# Patient Record
Sex: Male | Born: 1985
Health system: Southern US, Community
[De-identification: ages and names within clinical notes are randomized; demographics above are authoritative.]

## PROBLEM LIST (undated history)

## (undated) ENCOUNTER — Emergency Department (HOSPITAL_COMMUNITY): Admission: EM | Payer: 59 | Source: Home / Self Care

## (undated) DIAGNOSIS — F319 Bipolar disorder, unspecified: Secondary | ICD-10-CM

## (undated) DIAGNOSIS — F209 Schizophrenia, unspecified: Secondary | ICD-10-CM

## (undated) HISTORY — PX: APPENDECTOMY: SHX54

---

## 2014-12-07 ENCOUNTER — Emergency Department (HOSPITAL_COMMUNITY)
Admission: EM | Admit: 2014-12-07 | Discharge: 2014-12-08 | Disposition: A | Discharge status: Other Acute Inpatient Hospital | Payer: BLUE CROSS/BLUE SHIELD | Attending: Emergency Medicine | Admitting: Emergency Medicine

## 2014-12-07 ENCOUNTER — Encounter (HOSPITAL_COMMUNITY): Payer: Self-pay | Admitting: *Deleted

## 2014-12-07 DIAGNOSIS — F1210 Cannabis abuse, uncomplicated: Secondary | ICD-10-CM | POA: Insufficient documentation

## 2014-12-07 DIAGNOSIS — T450X2A Poisoning by antiallergic and antiemetic drugs, intentional self-harm, initial encounter: Secondary | ICD-10-CM | POA: Insufficient documentation

## 2014-12-07 DIAGNOSIS — Y929 Unspecified place or not applicable: Secondary | ICD-10-CM | POA: Insufficient documentation

## 2014-12-07 DIAGNOSIS — Y998 Other external cause status: Secondary | ICD-10-CM | POA: Insufficient documentation

## 2014-12-07 DIAGNOSIS — F329 Major depressive disorder, single episode, unspecified: Secondary | ICD-10-CM | POA: Insufficient documentation

## 2014-12-07 DIAGNOSIS — T50902A Poisoning by unspecified drugs, medicaments and biological substances, intentional self-harm, initial encounter: Secondary | ICD-10-CM

## 2014-12-07 DIAGNOSIS — Z720 Tobacco use: Secondary | ICD-10-CM | POA: Insufficient documentation

## 2014-12-07 DIAGNOSIS — Y939 Activity, unspecified: Secondary | ICD-10-CM | POA: Insufficient documentation

## 2014-12-07 DIAGNOSIS — F1220 Cannabis dependence, uncomplicated: Secondary | ICD-10-CM | POA: Diagnosis present

## 2014-12-07 DIAGNOSIS — F3163 Bipolar disorder, current episode mixed, severe, without psychotic features: Secondary | ICD-10-CM | POA: Diagnosis present

## 2014-12-07 DIAGNOSIS — T470X2A Poisoning by histamine H2-receptor blockers, intentional self-harm, initial encounter: Secondary | ICD-10-CM | POA: Insufficient documentation

## 2014-12-07 DIAGNOSIS — R45851 Suicidal ideations: Secondary | ICD-10-CM

## 2014-12-07 DIAGNOSIS — T391X2A Poisoning by 4-Aminophenol derivatives, intentional self-harm, initial encounter: Secondary | ICD-10-CM | POA: Insufficient documentation

## 2014-12-07 DIAGNOSIS — X58XXXA Exposure to other specified factors, initial encounter: Secondary | ICD-10-CM | POA: Insufficient documentation

## 2014-12-07 LAB — COMPREHENSIVE METABOLIC PANEL
ALT: 18 U/L (ref 17–63)
AST: 27 U/L (ref 15–41)
Albumin: 4.4 g/dL (ref 3.5–5.0)
Alkaline Phosphatase: 69 U/L (ref 38–126)
Anion gap: 9 (ref 5–15)
BILIRUBIN TOTAL: 1.4 mg/dL — AB (ref 0.3–1.2)
BUN: 13 mg/dL (ref 6–20)
CHLORIDE: 106 mmol/L (ref 101–111)
CO2: 25 mmol/L (ref 22–32)
Calcium: 9.4 mg/dL (ref 8.9–10.3)
Creatinine, Ser: 1.02 mg/dL (ref 0.61–1.24)
GFR calc Af Amer: >60 mL/min (ref >60)
GFR calc non Af Amer: >60 mL/min (ref >60)
Glucose, Bld: 85 mg/dL (ref 65–99)
POTASSIUM: 3.8 mmol/L (ref 3.5–5.1)
Sodium: 140 mmol/L (ref 135–145)
Total Protein: 7.6 g/dL (ref 6.5–8.1)

## 2014-12-07 LAB — URINALYSIS, ROUTINE W REFLEX MICROSCOPIC
Bilirubin Urine: NEGATIVE
Glucose, UA: NEGATIVE mg/dL
Hgb urine dipstick: NEGATIVE
Ketones, ur: NEGATIVE mg/dL
Leukocytes, UA: NEGATIVE
Nitrite: NEGATIVE
Protein, ur: NEGATIVE mg/dL
Specific Gravity, Urine: 1.011 (ref 1.005–1.030)
Urobilinogen, UA: 1 mg/dL (ref 0.0–1.0)
pH: 6.5 (ref 5.0–8.0)

## 2014-12-07 LAB — RAPID URINE DRUG SCREEN, HOSP PERFORMED
Amphetamines: NOT DETECTED
Barbiturates: NOT DETECTED
Benzodiazepines: NOT DETECTED
Cocaine: NOT DETECTED
Opiates: NOT DETECTED
TETRAHYDROCANNABINOL: POSITIVE — AB

## 2014-12-07 LAB — CBC WITH DIFFERENTIAL/PLATELET
BASOS PCT: 2 % — AB (ref 0–1)
Basophils Absolute: 0.1 10*3/uL (ref 0.0–0.1)
EOS PCT: 3 % (ref 0–5)
Eosinophils Absolute: 0.2 10*3/uL (ref 0.0–0.7)
HEMATOCRIT: 42.7 % (ref 39.0–52.0)
Hemoglobin: 13.9 g/dL (ref 13.0–17.0)
LYMPHS ABS: 1.9 10*3/uL (ref 0.7–4.0)
Lymphocytes Relative: 27 % (ref 12–46)
MCH: 27.2 pg (ref 26.0–34.0)
MCHC: 32.6 g/dL (ref 30.0–36.0)
MCV: 83.6 fL (ref 78.0–100.0)
Monocytes Absolute: 0.6 10*3/uL (ref 0.1–1.0)
Monocytes Relative: 8 % (ref 3–12)
Neutro Abs: 4.3 10*3/uL (ref 1.7–7.7)
Neutrophils Relative %: 60 % (ref 43–77)
Platelets: 186 10*3/uL (ref 150–400)
RBC: 5.11 MIL/uL (ref 4.22–5.81)
RDW: 14.2 % (ref 11.5–15.5)
WBC: 7.1 10*3/uL (ref 4.0–10.5)

## 2014-12-07 LAB — ACETAMINOPHEN LEVEL
ACETAMINOPHEN (TYLENOL), SERUM: 10 ug/mL (ref 10–30)
Acetaminophen (Tylenol), Serum: 26 ug/mL (ref 10–30)

## 2014-12-07 LAB — SALICYLATE LEVEL: Salicylate Lvl: <4 mg/dL (ref 2.8–30.0)

## 2014-12-07 LAB — ETHANOL: Alcohol, Ethyl (B): <5 mg/dL (ref <5)

## 2014-12-07 MED ORDER — NICOTINE 21 MG/24HR TD PT24: 21.0000 mg | MEDICATED_PATCH | Freq: Once | TRANSDERMAL | Status: DC

## 2014-12-07 MED ORDER — LORAZEPAM 1 MG PO TABS
1.0000 mg | ORAL_TABLET | Freq: Once | ORAL | Status: AC
  Administered 2014-12-07: 1 mg via ORAL
  Filled 2014-12-07: qty 1

## 2014-12-07 MED ORDER — SODIUM CHLORIDE 0.9 % IV BOLUS (SEPSIS)
1000.0000 mL | INTRAVENOUS | Status: AC
  Administered 2014-12-07: 1000 mL via INTRAVENOUS

## 2014-12-07 MED ORDER — CHARCOAL ACTIVATED PO LIQD
50.0000 g | Freq: Once | ORAL | Status: DC
  Filled 2014-12-07: qty 240

## 2014-12-07 NOTE — ED Notes (Signed)
Pt. Noted sleeping in room. No complaints or concerns voiced. No distress or abnormal behavior noted. Will continue to monitor with security cameras. Q 15 minute rounds continue. 

## 2014-12-07 NOTE — ED Notes (Signed)
Dr Aline Brochure aware that patient refused to drink charcoal.  Information acknowledged

## 2014-12-07 NOTE — ED Notes (Signed)
Bed: RESB Expected date: 12/07/14 Expected time: 12:22 PM Means of arrival: Ambulance Comments: SI attempt/ multiple drugs

## 2014-12-07 NOTE — BH Assessment (Signed)
Spoke with Alyse Low at [facility] who requested a copy of pt's UDS. Informed Alyse Low that a copy will be faxed over once it is resulted. Spoke with nurse who reported that they are still waiting for pt to provide urine. TTS will fax over results once it is resulted.

## 2014-12-07 NOTE — ED Provider Notes (Signed)
CSN: [id number]     Arrival date & time 12/07/14  1234 History   First MD Initiated Contact with Patient 12/07/14 1240     Chief Complaint  Patient presents with  . Suicidal     (Consider location/radiation/quality/duration/timing/severity/associated sxs/prior Treatment) Patient is a 29 y.o. male presenting with mental health disorder. The history is provided by the patient.  Mental Health Problem Presenting symptoms: suicidal thoughts and suicide attempt   Degree of incapacity (severity):  Moderate Onset quality:  Gradual Timing:  Constant Progression:  Worsening Chronicity:  Recurrent Context: stressful life event   Treatment compliance:  Some of the time Relieved by:  Nothing Worsened by:  Nothing tried Ineffective treatments:  None tried Associated symptoms: no abdominal pain, no chest pain and no headaches     History reviewed. No pertinent past medical history. History reviewed. No pertinent past surgical history. History reviewed. No pertinent family history. History  Substance Use Topics  . Smoking status: Current Every Day Smoker    Types: Cigarettes  . Smokeless tobacco: Not on file  . Alcohol Use: No     Comment: beer every once in a while - "yesterday had a boatload of vodka"    Review of Systems  Constitutional: Negative for fever.  HENT: Negative for drooling and rhinorrhea.   Eyes: Negative for pain.  Respiratory: Negative for cough and shortness of breath.   Cardiovascular: Negative for chest pain and leg swelling.  Gastrointestinal: Negative for nausea, vomiting, abdominal pain and diarrhea.  Genitourinary: Negative for dysuria and hematuria.  Musculoskeletal: Negative for gait problem and neck pain.  Skin: Negative for color change.  Neurological: Negative for numbness and headaches.  Hematological: Negative for adenopathy.  Psychiatric/Behavioral: Positive for suicidal ideas.  All other systems reviewed and are negative.     Allergies   Review of patient's allergies indicates no known allergies.  Home Medications   Prior to Admission medications   Not on File   BP 120/71 mmHg  Pulse 74  Temp(Src) 98.3 F (36.8 C) (Oral)  Resp 20  SpO2 100% Physical Exam  Constitutional: He is oriented to person, place, and time. He appears well-developed and well-nourished.  HENT:  Head: Normocephalic and atraumatic.  Right Ear: External ear normal.  Left Ear: External ear normal.  Nose: Nose normal.  Mouth/Throat: Oropharynx is clear and moist. No oropharyngeal exudate.  Eyes: Conjunctivae and EOM are normal. Pupils are equal, round, and reactive to light.  Neck: Normal range of motion. Neck supple.  Cardiovascular: Normal rate, regular rhythm, normal heart sounds and intact distal pulses.  Exam reveals no gallop and no friction rub.   No murmur heard. Pulmonary/Chest: Effort normal and breath sounds normal. No respiratory distress. He has no wheezes.  Abdominal: Soft. Bowel sounds are normal. He exhibits no distension. There is no tenderness. There is no rebound and no guarding.  Musculoskeletal: Normal range of motion. He exhibits no edema or tenderness.  Neurological: He is alert and oriented to person, place, and time.  Skin: Skin is warm and dry.  Psychiatric: He exhibits a depressed mood. He expresses suicidal ideation.  Nursing note and vitals reviewed.   ED Course  Procedures (including critical care time) Labs Review Labs Reviewed  CBC WITH DIFFERENTIAL/PLATELET - Abnormal; Notable for the following:    Basophils Relative 2 (*)    All other components within normal limits  COMPREHENSIVE METABOLIC PANEL - Abnormal; Notable for the following:    Total Bilirubin 1.4 (*)  All other components within normal limits  URINE RAPID DRUG SCREEN (HOSP PERFORMED) - Abnormal; Notable for the following:    Tetrahydrocannabinol POSITIVE (*)    All other components within normal limits  ACETAMINOPHEN LEVEL  SALICYLATE  LEVEL  ETHANOL  URINALYSIS, ROUTINE W REFLEX MICROSCOPIC  ACETAMINOPHEN LEVEL    Imaging Review No results found.   EKG Interpretation   Date/Time:  Sunday Dec 07 2014 12:37:22 EDT Ventricular Rate:  74 PR Interval:  130 QRS Duration: 85 QT Interval:  360 QTC Calculation: 399 R Axis:   87 Text Interpretation:  Sinus rhythm ST elev, probable normal early repol  pattern No prior for comparision Confirmed by DOCHERTY  MD, MEGAN [phone])  on 12/07/2014 12:40:21 PM      MDM   Final diagnoses:  Overdose, intentional self-harm, initial encounter    12:51 PM 29 y.o. male who presents after an overdose which occurred around noon today. He states that he took approximately 5000 mg of acetaminophen, and full of ibuprofen, 15 Zantac, 20 Claritin, smoke marijuana, snorted 3 puffs of hairspray. He states he did this over a several minute time period. He states that yesterday he was drinking alcohol. He is alert and oriented on exam. He is actively suicidal. We'll get screening labs and imaging including four-hour Tylenol level. Nurse to contact the poison center. Will give activated charcoal. He describes life stressors as the cause of his suicidal tendencies.  Pt continues to appear well. Dr. Ayesha Rumpf to f/u on 4hr tylenol level. Pt will otherwise be medically cleared. He was IVC'd pta.   Pamella Pert, MD 12/08/14 1754

## 2014-12-07 NOTE — ED Notes (Signed)
Pt sitting quietly in bed, tearful.  Pt ate approx 80% of supper,  Pt declined nic. Patch or ativan at this time
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
## 2014-12-07 NOTE — BH Assessment (Signed)
Consulted with Waylan Boga, DNP who states that patient meets inpatient criteria at this time.

## 2014-12-07 NOTE — ED Notes (Signed)
Attempted to call report.  Nurse unavailable.  She is to return call

## 2014-12-07 NOTE — ED Notes (Signed)
Report received from Janie Rambo RN. Pt. Sleeping, respirations regular and unlabored. Will continue to monitor for safety via security cameras and Q 15 minute checks. 

## 2014-12-07 NOTE — ED Notes (Signed)
Pt took over 5000 mg acetomenaphe; handful of ibuprofen, 15 pills of zantac, 20 pills of claritinn, smoked marijuana, snorted 3 puffs of hairspray. Hx of attempting to harm self  Hx: manic bipolar depressive, chronic bronchitis, anxiety Noncompliant with medications A&O Cooperative

## 2014-12-07 NOTE — ED Provider Notes (Signed)
Patient visit shared with Dr. Aline Brochure. Patient here involuntarily following suicide attempt by overdose. Repeat acetaminophen level is less than 10, no indication for NAC treatment. Patient has been medically cleared for psychiatric evaluation.  Quintella Reichert, MD 12/07/14 2352

## 2014-12-08 ENCOUNTER — Inpatient Hospital Stay (HOSPITAL_COMMUNITY)
Admission: AD | Admit: 2014-12-08 | Discharge: 2014-12-11 | DRG: 885 | Disposition: A | Discharge status: Home / Self Care | Payer: BLUE CROSS/BLUE SHIELD | Source: Intra-hospital | Attending: Psychiatry | Admitting: Psychiatry

## 2014-12-08 ENCOUNTER — Encounter (HOSPITAL_COMMUNITY): Payer: Self-pay | Admitting: *Deleted

## 2014-12-08 DIAGNOSIS — T50902A Poisoning by unspecified drugs, medicaments and biological substances, intentional self-harm, initial encounter: Secondary | ICD-10-CM | POA: Diagnosis not present

## 2014-12-08 DIAGNOSIS — T391X2A Poisoning by 4-Aminophenol derivatives, intentional self-harm, initial encounter: Secondary | ICD-10-CM

## 2014-12-08 DIAGNOSIS — F3160 Bipolar disorder, current episode mixed, unspecified: Principal | ICD-10-CM | POA: Diagnosis present

## 2014-12-08 DIAGNOSIS — F1220 Cannabis dependence, uncomplicated: Secondary | ICD-10-CM | POA: Insufficient documentation

## 2014-12-08 DIAGNOSIS — R45851 Suicidal ideations: Secondary | ICD-10-CM

## 2014-12-08 DIAGNOSIS — F17210 Nicotine dependence, cigarettes, uncomplicated: Secondary | ICD-10-CM | POA: Diagnosis present

## 2014-12-08 DIAGNOSIS — T1491 Suicide attempt: Secondary | ICD-10-CM | POA: Diagnosis not present

## 2014-12-08 DIAGNOSIS — F3163 Bipolar disorder, current episode mixed, severe, without psychotic features: Secondary | ICD-10-CM | POA: Diagnosis present

## 2014-12-08 DIAGNOSIS — T39312A Poisoning by propionic acid derivatives, intentional self-harm, initial encounter: Secondary | ICD-10-CM

## 2014-12-08 DIAGNOSIS — F1290 Cannabis use, unspecified, uncomplicated: Secondary | ICD-10-CM | POA: Diagnosis present

## 2014-12-08 DIAGNOSIS — F332 Major depressive disorder, recurrent severe without psychotic features: Secondary | ICD-10-CM | POA: Diagnosis present

## 2014-12-08 DIAGNOSIS — Z9114 Patient's other noncompliance with medication regimen: Secondary | ICD-10-CM | POA: Diagnosis present

## 2014-12-08 MED ORDER — TRAZODONE HCL 100 MG PO TABS: 100.0000 mg | ORAL_TABLET | Freq: Every evening | ORAL | Status: DC | PRN

## 2014-12-08 MED ORDER — OLANZAPINE 10 MG PO TBDP: 10.0000 mg | ORAL_TABLET | Freq: Three times a day (TID) | ORAL | Status: DC | PRN

## 2014-12-08 MED ORDER — TRAZODONE HCL 50 MG PO TABS
50.0000 mg | ORAL_TABLET | Freq: Every evening | ORAL | Status: DC | PRN
  Administered 2014-12-08 – 2014-12-10 (×3): 50 mg via ORAL
  Filled 2014-12-08 (×2): qty 1
  Filled 2014-12-08: qty 6
  Filled 2014-12-08: qty 1
  Filled 2014-12-08: qty 6
  Filled 2014-12-08 (×5): qty 1

## 2014-12-08 MED ORDER — DIVALPROEX SODIUM 500 MG PO DR TAB
500.0000 mg | DELAYED_RELEASE_TABLET | Freq: Two times a day (BID) | ORAL | Status: DC
  Administered 2014-12-08 (×2): 500 mg via ORAL
  Filled 2014-12-08 (×3): qty 1
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
## 2014-12-08 NOTE — ED Notes (Signed)
Patient refused vitals, when writer asked if she could take his vitals the patient shook his head no. RN notified.

## 2014-12-08 NOTE — ED Notes (Addendum)
Report to RN Brook., [facility] rm [phone], [employer] transport requested.

## 2014-12-08 NOTE — BH Assessment (Signed)
[place] Assessment Progress Note  The following facilities have been contacted to seek placement for this pt, with results as noted:  Beds available, information sent, decision pending:  [facility]  No beds available, but willing to accept referral for future consideration:  [facility]  At capacity:  [facility]  Left message, awaiting call back:  [facility] [place], [place] Triage Specialist [phone]

## 2014-12-08 NOTE — BHH Counselor (Signed)
Per Letitia Libra, RN, [facility], patient accepted to [facility] [address] Dr. Parke Poisson attending. LCSW spoke with Dr. Jeneen Rinks, EDP at [phone] to request discharge.

## 2014-12-08 NOTE — ED Notes (Signed)
Pt. Noted sleeping in room. No complaints or concerns voiced. No distress or abnormal behavior noted. Will continue to monitor with security cameras. Q 15 minute rounds continue. 

## 2014-12-08 NOTE — ED Notes (Signed)
Pt AAO x 3, no distress noted, watching TV at present, remains passive SI, monitoring for safety, Q 15 min checks in effect.

## 2014-12-08 NOTE — Progress Notes (Signed)
CM spoke with pt who confirms self pay [place] resident with no pcp.  CM discussed and provided written information for self pay pcps, discussed the importance of pcp vs EDP services for f/u care, www.needymeds.org, www.goodrx.com, discounted pharmacies and other [place] resources such as [facility] , [facility], affordable care act,  [place] med assist, financial assistance, self pay dental services, [place] med assist, DSS and  health department  Reviewed resources for [place] self pay pcps like [facility], [facility] [address], [facility], [facility], local urgent care centers, [facility], [facility], general medical clinics, [facility], [facility] plus others, medication resources, [facility] out patient pharmacies and housing Pt voiced understanding and appreciation of resources provided   Provided P4CC contact information Pt agreed to a referral Cm completed referral Pt to be contact by [facility] clinical liason 

## 2014-12-08 NOTE — Progress Notes (Signed)
  [facility] ED CM spoke with pt on how to obtain an in network pcp with insurance coverage via the customer service number or web site  Cm reviewed ED level of care for crisis/emergent services and community pcp level of care to manage continuous or chronic medical concerns.  The pt voiced understanding CM encouraged pt and discussed pt's responsibility to verify with pt's insurance carrier that any recommended medical provider offered by any emergency room or a hospital provider is within the carrier's network. The pt voiced understanding

## 2014-12-08 NOTE — ED Notes (Signed)
[facility] at bedside to transport pt to [facility].

## 2014-12-08 NOTE — ED Notes (Signed)
Patient has been tearful off and on this afternoon.  He did finally call some family and states he has changed his mind and now wants to live.  States he needs to be there for his kids.  Agreed to start the Depakote and has started eating and drinking again.  Has been cooperative and states he is happy he may be going over to [facility] for help.

## 2014-12-08 NOTE — Tx Team (Signed)
Initial Interdisciplinary Treatment Plan   PATIENT STRESSORS: Marital or family conflict Medication change or noncompliance   PATIENT STRENGTHS: Ability for insight Average or above average intelligence Capable of independent living General fund of knowledge Motivation for treatment/growth Supportive family/friends   PROBLEM LIST: Problem List/Patient Goals Date to be addressed Date deferred Reason deferred Estimated date of resolution  "Need to get back on my meds" 12/08/14     depression 12/08/14     Suicidal ideation 12/08/14                                          DISCHARGE CRITERIA:  Ability to meet basic life and health needs Improved stabilization in mood, thinking, and/or behavior Verbal commitment to aftercare and medication compliance  PRELIMINARY DISCHARGE PLAN: Attend aftercare/continuing care group Return to previous living arrangement  PATIENT/FAMIILY INVOLVEMENT: This treatment plan has been presented to and reviewed with the patient, Clinton Dean, and/or family member, .  The patient and family have been given the opportunity to ask questions and make suggestions.  [place], [place] 12/08/2014, 11:46 PM

## 2014-12-08 NOTE — Progress Notes (Signed)
29 year old male pt admitted on involuntary basis. Pt reports that he took an overdose after conflict with girlfriend. Pt reports that his mood has been up and down the past few years and reports that he has been off all his medications for the past 5 years and spoke about the need to get back on his medications. Pt reports that he lives with his girlfriend and children and will go back there after discharge. Pt reports that [place]and has only been in the area about 5 months. Pt denied any feelings of SI on admission and able to contract for safety on the unit. Pt did appear depressed on admission. Pt was oriented to the unit and safety maintained.

## 2014-12-09 ENCOUNTER — Encounter (HOSPITAL_COMMUNITY): Payer: Self-pay | Admitting: Psychiatry

## 2014-12-09 DIAGNOSIS — T50902A Poisoning by unspecified drugs, medicaments and biological substances, intentional self-harm, initial encounter: Secondary | ICD-10-CM

## 2014-12-09 DIAGNOSIS — F332 Major depressive disorder, recurrent severe without psychotic features: Secondary | ICD-10-CM | POA: Diagnosis present

## 2014-12-09 MED ORDER — DIVALPROEX SODIUM ER 500 MG PO TB24
500.0000 mg | ORAL_TABLET | Freq: Two times a day (BID) | ORAL | Status: DC
  Administered 2014-12-09 – 2014-12-11 (×4): 500 mg via ORAL
  Filled 2014-12-09 (×4): qty 1
  Filled 2014-12-09: qty 6
  Filled 2014-12-09: qty 1
  Filled 2014-12-09: qty 6
  Filled 2014-12-09: qty 1

## 2014-12-09 MED ORDER — DIVALPROEX SODIUM ER 250 MG PO TB24
250.0000 mg | ORAL_TABLET | Freq: Two times a day (BID) | ORAL | Status: DC
  Filled 2014-12-09 (×2): qty 1

## 2014-12-09 MED ORDER — MAGNESIUM HYDROXIDE 400 MG/5ML PO SUSP
30.0000 mL | Freq: Every day | ORAL | Status: DC | PRN
Start: 2014-12-09 — End: 2014-12-12

## 2014-12-09 MED ORDER — LORATADINE 10 MG PO TABS
10.0000 mg | ORAL_TABLET | Freq: Every day | ORAL | Status: DC
  Administered 2014-12-09 – 2014-12-11 (×3): 10 mg via ORAL
  Filled 2014-12-09 (×5): qty 1

## 2014-12-09 MED ORDER — FAMOTIDINE 20 MG PO TABS
20.0000 mg | ORAL_TABLET | Freq: Two times a day (BID) | ORAL | Status: DC
  Administered 2014-12-09 – 2014-12-11 (×5): 20 mg via ORAL
  Filled 2014-12-09 (×9): qty 1

## 2014-12-09 MED ORDER — OLANZAPINE 5 MG PO TABS
5.0000 mg | ORAL_TABLET | Freq: Every day | ORAL | Status: DC
  Administered 2014-12-09 – 2014-12-10 (×2): 5 mg via ORAL
  Filled 2014-12-09 (×2): qty 1
  Filled 2014-12-09: qty 3
  Filled 2014-12-09: qty 1

## 2014-12-09 MED ORDER — IBUPROFEN 800 MG PO TABS: 800.0000 mg | ORAL_TABLET | Freq: Four times a day (QID) | ORAL | Status: DC | PRN

## 2014-12-09 MED ORDER — ALUM & MAG HYDROXIDE-SIMETH 200-200-20 MG/5ML PO SUSP
30.0000 mL | ORAL | Status: DC | PRN
  Administered 2014-12-09: 30 mL via ORAL
  Filled 2014-12-09: qty 30

## 2014-12-09 NOTE — BHH Suicide Risk Assessment (Signed)
[facility] Admission Suicide Risk Assessment   Nursing information obtained from:    Demographic factors:   29 year old employed male, lives with fiance and two children Current Mental Status:   see below Loss Factors:   relationship stressors Historical Factors:   history of bipolar disorder diagnosis, cannabis dependence  Risk Reduction Factors:   resilience, physical health Total Time spent with patient: 45 minutes Principal Problem: <principal problem not specified> Diagnosis:   Patient Active Problem List   Diagnosis Date Noted  . MDD (major depressive disorder), recurrent severe, without psychosis [F33.2] 12/09/2014  . Bipolar affective disorder, current episode mixed, without psychotic features [F31.60] 12/08/2014  . Cannabis dependence, continuous [F12.20] 12/08/2014     Continued Clinical Symptoms:  Alcohol Use Disorder Identification Test Final Score (AUDIT): 2 The "Alcohol Use Disorders Identification Test", Guidelines for Use in Primary Care, Second Edition.  World Pharmacologist [facility] Score between 0-7:  no or low risk or alcohol related problems. Score between 8-15:  moderate risk of alcohol related problems. Score between 16-19:  high risk of alcohol related problems. Score 20 or above:  warrants further diagnostic evaluation for alcohol dependence and treatment.   CLINICAL FACTORS:  29 year old male, lives with SO, states has a history of Bipolar Disorder, and had been stable and doing well on Depakote ER/Zyprexa combination, but had not been taking these medications for months to years due initially to insurance constraints and later concerns about stigma and labelling associated with taking psychiatric medications. States recently feeling depressed, unmotivated, and had a recent impulsive overdose on OTC medications, in the context of argument with SO. Also endorses cannabis dependence .   Musculoskeletal: Strength & Muscle Tone: within normal limits Gait &  Station: normal Patient leans: N/A  Psychiatric Specialty Exam: Physical Exam  ROS  Blood pressure 116/69, pulse 86, temperature 98.1 F (36.7 C), temperature source Oral, resp. rate 17, height 5\' 7"  (1.702 m), weight 155 lb (70.308 kg).Body mass index is 24.27 kg/(m^2).  SEE ADMIT NOTE MSE                                                        COGNITIVE FEATURES THAT CONTRIBUTE TO RISK:  Closed-mindedness    SUICIDE RISK:   Moderate:  Frequent suicidal ideation with limited intensity, and duration, some specificity in terms of plans, no associated intent, good self-control, limited dysphoria/symptomatology, some risk factors present, and identifiable protective factors, including available and accessible social support.  PLAN OF CARE: Patient will be admitted to inpatient psychiatric unit for stabilization and safety. Will provide and encourage milieu participation. Provide medication management and maked adjustments as needed.  Will follow daily.    Medical Decision Making:  Review of Psycho-Social Stressors (1), Review or order clinical lab tests (1), Established Problem, Worsening (2) and Review of Medication Regimen & Side Effects (2)  I certify that inpatient services furnished can reasonably be expected to improve the patient's condition.   [place], [place] 12/09/2014, 11:45 AM
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
## 2014-12-09 NOTE — Progress Notes (Signed)
Patient ID: Clinton Dean, male   DOB: [birth year], 29 y.o.   MRN: [id number]  Pt currently presents with a flat affect and anxious behavior. Per self inventory, pt rates depression at a 0, hopelessness 0 and anxiety 1. Pt's daily goal is "going home" and they intend to do so by "whatever it takes, I need to get back home and back to work." Pt reports poor sleep, good concentration and a good appetite.  Pt provided with medications per providers orders. Pt's labs and vitals were monitored throughout the day. Pt supported emotionally and encouraged to express concerns and questions. Pt educated on medications. Pt's safety ensured with 15 minute and environmental checks. Pt currently denies SI/HI and A/V hallucinations. Pt verbally agrees to seek staff if SI/HI or A/VH occurs and to consult with staff before acting on these thoughts. Pt states after consulting with provider, "I'm going to go home on Friday, I'm going to get back on meds while I'm here." Pt main concern is nausea that may accompany medications.
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
## 2014-12-09 NOTE — Progress Notes (Signed)
Adult Psychoeducational Group Note  Date:  12/09/2014 Time:  0900  Group Topic/Focus:  Orientation:   The focus of this group is to educate the patient on the purpose and policies of crisis stabilization and provide a format to answer questions about their admission.  The group details unit policies and expectations of patients while admitted.  Participation Level:  Active  Participation Quality:  Appropriate  Affect:  Appropriate  Cognitive:  Appropriate  Insight: Appropriate  Engagement in Group:  Engaged  Modes of Intervention:  Education  Additional Comments:    Octavia Mottola L 12/09/2014, 10:05 AM

## 2014-12-09 NOTE — Progress Notes (Signed)
Recreation Therapy Notes  Animal-Assisted Activity (AAA) Program Checklist/Progress Notes Patient Eligibility Criteria Checklist & Daily Group note for Rec Tx Intervention  Date: 12/09/14 Time: 2:30pm Location: [address]   AAA/T Program Assumption of Risk Form signed by Patient/ or Parent Legal Guardian yes  Patient is free of allergies or sever asthma yes  Patient reports no fear of animals yes  Patient reports no history of cruelty to animalsyes  Patient understands his/her participation is voluntary yes  Patient washes hands before animal contact yes  Patient washes hands after animal contact yes  Education: Hand Washing, Appropriate Animal Interaction   Education Outcome: Acknowledges understanding/In group clarification offered/Needs additional education.   Clinical Observations/Feedback: Patient did not attend group.   Victorino Sparrow, LRT/CTRS         Victorino Sparrow A 12/09/2014 4:32 PM
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
## 2014-12-09 NOTE — BHH Group Notes (Signed)
[facility] LCSW Group Therapy  12/09/2014   1:15 PM   Type of Therapy:  Group Therapy  Participation Level:  Active  Participation Quality:  Attentive, Sharing and Supportive  Affect:  Appropriate  Cognitive:  Alert and Oriented  Insight:  Developing/Improving and Engaged  Engagement in Therapy:  Developing/Improving and Engaged  Modes of Intervention:  Clarification, Confrontation, Discussion, Education, Exploration, Limit-setting, Orientation, Problem-solving, Rapport Building, Art therapist, Socialization and Support  Summary of Progress/Problems: The topic for group therapy was feelings about diagnosis.  Pt actively participated in group discussion on their past and current diagnosis and how they feel towards this.  Pt also identified how society and family members judge them, based on their diagnosis as well as stereotypes and stigmas.  Patient discussed experiencing severe mood swings and instability due to not taking his medications for 5 years. Patient expressed the importance of medication compliance. He related to other group members' experiences of not being understood by family. He shared some of his coping skills such as gardening and recording music. Patient stated that he feels that his mental illness helps him to be creative and intelligent. CSW and other group members provided patient with emotional support and encouragement.  Tilden Fossa, MSW, [place] Worker [facility] [phone]

## 2014-12-09 NOTE — Progress Notes (Signed)
Patient ID: Clinton Dean, male   DOB: 1985/10/28, 29 y.o.   MRN: [id number] D: Client visible on the unit, interacts appropriately with staff and peers. Client  Reports "day been excellent" "that Depakote stop my mind from thinking a million things at once" "I should have been back on my medication"  "my GF dropped the charges, cause she knew, I needed my medication. "I will never stopped it again." Client denies depression and anxiety. A: Writer provided emotional support encouraged client to continue medication regime as he notes a change in emotional well being when he is compliant. Staff will maintain safety with q73min checks. R: Client is safe on the unit, attended group.

## 2014-12-09 NOTE — Progress Notes (Deleted)
Recreation Therapy Notes  Animal-Assisted Activity (AAA) Program Checklist/Progress Notes Patient Eligibility Criteria Checklist & Daily Group note for Rec Tx Intervention  Date: 12/08/14 Time: 2:30pm Location: [address]   AAA/T Program Assumption of Risk Form signed by Patient/ or Parent Legal Guardian yes  Patient is free of allergies or sever asthma yes  Patient reports no fear of animals yes  Patient reports no history of cruelty to animalsyes  Patient understands his/her participation is voluntary yes  Patient washes hands before animal contact yes  Patient washes hands after animal contact yes  Behavioral Response: Engaged, appropriate  Education: Contractor, Appropriate Animal Interaction   Education Outcome: Acknowledges understanding/In group clarification offered/Needs additional education.   Clinical Observations/Feedback: Patient pet dog and asked questions.  Patient left group at 3:10pm.   Victorino Sparrow, LRT/CTRS         Victorino Sparrow A 12/09/2014 4:48 PM

## 2014-12-10 LAB — TSH: TSH: 2.064 u[IU]/mL (ref 0.350–4.500)

## 2014-12-10 LAB — LIPID PANEL
CHOL/HDL RATIO: 3.6 ratio
Cholesterol: 151 mg/dL (ref 0–200)
HDL: 42 mg/dL (ref >40)
LDL CALC: 98 mg/dL (ref 0–99)
Triglycerides: 55 mg/dL (ref <150)
VLDL: 11 mg/dL (ref 0–40)

## 2014-12-10 MED ORDER — PANTOPRAZOLE SODIUM 20 MG PO TBEC
20.0000 mg | DELAYED_RELEASE_TABLET | Freq: Every day | ORAL | Status: DC
  Administered 2014-12-10 – 2014-12-11 (×2): 20 mg via ORAL
  Filled 2014-12-10 (×4): qty 1

## 2014-12-10 NOTE — Progress Notes (Signed)
[facility] MD Progress Note  12/10/2014 11:46 AM Clinton Dean  MRN:  [id number] Subjective:   Patient reports that he is feeling better . He denies medication side effects. As noted, he states he has been on these medications in the past and did not have any appreciable side effects and felt much better on them. Objective: I have discussed case with treatment team and have met with patient. He is currently doing better- on unit has been calm, pleasant and has been going to groups.  Patient reports he feels better on the medications and states in particular that he feels his thought process is slower and that he is more focused. We spent time reviewing side effects from his current medication regimen- we reviewed potential for Zyprexa to cause metabolic syndrome, sedation, and weight gain, as well as movement disorder, and the potential for Depakote to cause hepatic, hematologic side effects and the importance of regular outpatient follow ups for medication monitoring after discharge- he expressed understanding. States he did not have any weight gain or other side effects when he was on this medication combination in the past. We reviewed negative impact that cannabis can have on his level of functioning, how it can cause decrease in motivation, and encouraged patient to consider abstinence , sobriety after discharge- insight and motivation into cannabis abstinence seems limited at this time. TSH WNL, Lipid panel WNL.   Principal Problem: Suicide attempt by drug ingestion Diagnosis:   Patient Active Problem List   Diagnosis Date Noted  . MDD (major depressive disorder), recurrent severe, without psychosis [F33.2] 12/09/2014  . Suicide attempt by drug ingestion [T50.902A] 12/09/2014  . Bipolar affective disorder, current episode mixed, without psychotic features [F31.60] 12/08/2014  . Cannabis dependence, continuous [F12.20] 12/08/2014   Total Time spent with patient: 25 minutes    Past Medical  History: History reviewed. No pertinent past medical history. History reviewed. No pertinent past surgical history. Family History: History reviewed. No pertinent family history. Social History:  History  Alcohol Use No    Comment: beer every once in a while - "yesterday had a boatload of vodka"     History  Drug Use Not on file    History   Social History  . Marital Status: Single    Spouse Name: N/A  . Number of Children: N/A  . Years of Education: N/A   Social History Main Topics  . Smoking status: Current Every Day Smoker -- 0.50 packs/day    Types: Cigarettes  . Smokeless tobacco: Not on file  . Alcohol Use: No     Comment: beer every once in a while - "yesterday had a boatload of vodka"  . Drug Use: Not on file  . Sexual Activity: Not on file   Other Topics Concern  . None   Social History Narrative   Additional History:    Sleep: Fair  Appetite:  Good   Assessment:   Musculoskeletal: Strength & Muscle Tone: within normal limits Gait & Station: normal Patient leans: N/A   Psychiatric Specialty Exam: Physical Exam  ROS- denies tremors, no excessive sedation, no increased appetite, no vomiting, no GI bleeding reported. Does report history of " heartburn"/GERD.   Blood pressure 121/68, pulse 75, temperature 97.5 F (36.4 C), temperature source Oral, resp. rate 18, height 5' 7"  (1.702 m), weight 155 lb (70.308 kg).Body mass index is 24.27 kg/(m^2).  General Appearance: improved grooming  Eye Contact::  Good  Speech:  Normal Rate  Volume:  Normal  Mood:  improved  Affect:  Appropriate  Thought Process:  Goal Directed and Linear  Orientation:  Full (Time, Place, and Person)  Thought Content:  denies hallucinations, no delusions, not internally preoccupied   Suicidal Thoughts:  No- at this time denies any thoughts of hurting self or anyone else   Homicidal Thoughts:  No  Memory:  Recent and remote grossly intact   Judgement:  Other:  improving  Insight:   Fair  Psychomotor Activity:  Normal  Concentration:  Good  Recall:  Good  Fund of Knowledge:Good  Language: Good  Akathisia:  Negative  Handed:  Right  AIMS (if indicated):     Assets:  Communication Skills Desire for Improvement Resilience Social Support  ADL's: improving   Cognition: WNL  Sleep:  Number of Hours: 5     Current Medications: Current Facility-Administered Medications  Medication Dose Route Frequency Provider Last Rate Last Dose  . alum & mag hydroxide-simeth (MAALOX/MYLANTA) 200-200-20 MG/5ML suspension 30 mL  30 mL Oral Q4H PRN Laverle Hobby, PA-C   30 mL at 12/09/14 1529  . divalproex (DEPAKOTE ER) 24 hr tablet 500 mg  500 mg Oral BID Jenne Campus, MD   500 mg at 12/10/14 0825  . famotidine (PEPCID) tablet 20 mg  20 mg Oral BID Laverle Hobby, PA-C   20 mg at 12/10/14 0825  . ibuprofen (ADVIL,MOTRIN) tablet 800 mg  800 mg Oral Q6H PRN Laverle Hobby, PA-C      . loratadine (CLARITIN) tablet 10 mg  10 mg Oral Daily Laverle Hobby, PA-C   10 mg at 12/10/14 0825  . magnesium hydroxide (MILK OF MAGNESIA) suspension 30 mL  30 mL Oral Daily PRN Laverle Hobby, PA-C      . OLANZapine (ZYPREXA) tablet 5 mg  5 mg Oral QHS Jenne Campus, MD   5 mg at 12/09/14 2245  . traZODone (DESYREL) tablet 50 mg  50 mg Oral QHS,MR X 1 Laverle Hobby, PA-C   50 mg at 12/09/14 2245    Lab Results:  Results for orders placed or performed during the hospital encounter of 12/08/14 (from the past 48 hour(s))  Lipid panel     Status: None   Collection Time: 12/10/14  6:20 AM  Result Value Ref Range   Cholesterol 151 0 - 200 mg/dL   Triglycerides 55 <150 mg/dL   HDL 42 >40 mg/dL   Total CHOL/HDL Ratio 3.6 RATIO   VLDL 11 0 - 40 mg/dL   LDL Cholesterol 98 0 - 99 mg/dL    Comment:        Total Cholesterol/HDL:CHD Risk Coronary Heart Disease Risk Table                     Men   Women  1/2 Average Risk   3.4   3.3  Average Risk       5.0   4.4  2 X Average Risk   9.6    7.1  3 X Average Risk  23.4   11.0        Use the calculated Patient Ratio above and the CHD Risk Table to determine the patient's CHD Risk.        ATP III CLASSIFICATION (LDL):  <100     mg/dL   Optimal  100-129  mg/dL   Near or Above                    Optimal  130-159  mg/dL   Borderline  160-189  mg/dL   High  >190     mg/dL   Very High Performed at [facility]   TSH     Status: None   Collection Time: 12/10/14  6:20 AM  Result Value Ref Range   TSH 2.064 0.350 - 4.500 uIU/mL    Comment: Performed at [facility]    Physical Findings: AIMS: Facial and Oral Movements Muscles of Facial Expression: None, normal Lips and Perioral Area: None, normal Jaw: None, normal Tongue: None, normal,Extremity Movements Upper (arms, wrists, hands, fingers): None, normal Lower (legs, knees, ankles, toes): None, normal, Trunk Movements Neck, shoulders, hips: None, normal, Overall Severity Severity of abnormal movements (highest score from questions above): None, normal Incapacitation due to abnormal movements: None, normal Patient's awareness of abnormal movements (rate only patient's report): No Awareness, Dental Status Current problems with teeth and/or dentures?: No Does patient usually wear dentures?: No  CIWA:    COWS:     Assessment- patient is improving- states that on medications ( Depakote ER and Zyprexa- both of which he had been on a few years ago with good clinical response and no side effects) he feels better, and currently feels his thoughts are slower, more organized, and his mood is better. Denies side effects. Has a history of cannabis dependence, smokes daily most of the time. Insight regarding how cannabis may be negatively affecting his mental health, level of functioning is limited at this time.   Treatment Plan Summary: Daily contact with patient to assess and evaluate symptoms and progress in treatment, Medication management, Plan continue  inpatient admission and medications as below  1. Continue Zyprexa 5 mgrs QHS for mood disorder  2. Continue Depakote ER  500 mgrs BID for mood disorder 3. Obtain Valproic Acid Serum level in AM to monitor.  4. Continue Pepcid for GERD. Add  PPI ( protonix ) . 5. Continue to address Cannabis Dependence by  Working  on increasing insight and motivation to maintain abstinence from cannabis. 5. No current grounds for involuntary commitment- will switch to Voluntary legal status .   Medical Decision Making:  Established Problem, Stable/Improving (1), Review of Psycho-Social Stressors (1), Review or order clinical lab tests (1), Review of Medication Regimen & Side Effects (2) and Review of New Medication or Change in Dosage (2)     COBOS, FERNANDO 12/10/2014, 11:46 AM

## 2014-12-10 NOTE — BHH Suicide Risk Assessment (Signed)
[facility] INPATIENT:  Family/Significant Other Suicide Prevention Education  Suicide Prevention Education:  Education Completed; Leta Baptist; Jackqulyn Livings [phone]; has been identified by the patient as the family member/significant other with whom the patient will be residing, and identified as the person(s) who will aid the patient in the event of a mental health crisis (suicidal ideations/suicide attempt).  With written consent from the patient, the family member/significant other has been provided the following suicide prevention education, prior to the and/or following the discharge of the patient.  The suicide prevention education provided includes the following:  Suicide risk factors  Suicide prevention and interventions  National Suicide Hotline telephone number  [facility] assessment telephone number  [facility] Emergency Assistance [place] and/or Residential Mobile Crisis Unit telephone number  Request made of family/significant other to:  Remove weapons (e.g., guns, rifles, knives), all items previously/currently identified as safety concern.  Finacedvised patient does not have access to weapons.    Remove drugs/medications (over-the-counter, prescriptions, illicit drugs), all items previously/currently identified as a safety concern.  The family member/significant other verbalizes understanding of the suicide prevention education information provided.  The family member/significant other agrees to remove the items of safety concern listed above.  [name] 12/10/2014, 3:42 PM

## 2014-12-10 NOTE — BHH Group Notes (Signed)
[facility] LCSW Group Therapy  Emotional Regulation 1:15 - 2: 30 PM        12/10/2014  2:52 PM    Type of Therapy:  Group Therapy  Participation Level:  Appropriate  Participation Quality:  Appropriate  Affect:  Appropriate  Cognitive:  Attentive Appropriate  Insight:  Developing/Improving Engaged  Engagement in Therapy:  Developing/Improving Engaged  Modes of Intervention:  Discussion Exploration Problem-Solving Supportive  Summary of Progress/Problems:  Group topic was emotional regulations.  Patient participated in the discussion and was able to identify sadness as the emotion he needs to regulate.  Patient was able to identify approprite coping skills including not isolating from family/friends.  [name] 12/10/2014   2:52 PM

## 2014-12-10 NOTE — Progress Notes (Signed)
Recreation Therapy Notes  Date: 12/10/14 Time: 9:30am Location: [address] Group Room  Group Topic: Stress Management  Goal Area(s) Addresses:  Patient will verbalize importance of using healthy stress management.  Patient will identify positive emotions associated with healthy stress management.   Intervention: Progressive Muscle Relaxation Script  Activity :  Patient will listen as LRT leads them through progressive muscle relaxation.  Patients will be lead through each muscle group from head to toe.  Education:  Stress Management, Discharge Planning.   Education Outcome: Acknowledges edcuation/In group clarification offered/Needs additional education  Clinical Observations/Feedback: Patient did not attend.  Victorino Sparrow, LRT/CTRS         Ria Comment, [place] Coffin A 12/10/2014 1:45 PM

## 2014-12-10 NOTE — Progress Notes (Signed)
Adult Psychoeducational Group Note  Date:  12/10/2014 Time:  9:14 PM  Group Topic/Focus:  Wrap-Up Group:   The focus of this group is to help patients review their daily goal of treatment and discuss progress on daily workbooks.  Participation Level:  Active  Participation Quality:  Appropriate and Attentive  Affect:  Appropriate  Cognitive:  Appropriate  Insight: Appropriate  Engagement in Group:  Engaged  Modes of Intervention:  Discussion  Additional Comments:  Pt stated he had an excellent day today. Pt stated he is not necessarily glad of what happened that brought him here, but he is glad that he is here and was able to meet and talk with people who have been through similar situations and can relate to him. Pt also stated he is glad to be back on his medications.  Clint Bolder 12/10/2014, 9:14 PM

## 2014-12-10 NOTE — Progress Notes (Signed)
Patient ID: Clinton Dean, male   DOB: 10-22-1985, 29 y.o.   MRN: [id number]   Pt currently presents with a flat affect and depressed behavior. Per self inventory, pt rates depression, hopelessness and anxiety at 0. Pt's daily goal is "balancing my meds" and they intend to do so by "relax." Pt reports fair sleep, good concentration and a good appetite.   Pt provided with medications per providers orders. Pt's labs and vitals were monitored throughout the day. Pt supported emotionally and encouraged to express concerns and questions. Pt educated on medications.  Pt's safety ensured with 15 minute and environmental checks. Pt currently denies SI/HI and A/V hallucinations. Pt verbally agrees to seek staff if SI/HI or A/VH occurs and to consult with staff before acting on these thoughts. Pt requests writer to "print off information on roofing and insulation inspection." Pt reports "I'm planning on going home Thursday so I can keep my job."

## 2014-12-10 NOTE — [facility] Group Notes (Signed)
   [facility] LCSW Aftercare Discharge Planning Group Note  12/10/2014  8:45 AM   Participation Quality: Alert, Appropriate and Oriented  Mood/Affect: Appropriate  Depression Rating: 0  Anxiety Rating: 1  Thoughts of Suicide: Pt denies SI/HI  Will you contract for safety? Yes  Current AVH: Pt denies  Plan for Discharge/Comments: Pt attended discharge planning group and actively participated in group. CSW provided pt with today's workbook. Patient plans to return home at discharge to follow up with outpatient services. Referral sent to [facility].  Transportation Means: Pt reports access to transportation  Supports: Patient identifies family as supportive.  Tilden Fossa, MSW, [place] Worker [facility] [phone]

## 2014-12-11 DIAGNOSIS — F1220 Cannabis dependence, uncomplicated: Secondary | ICD-10-CM | POA: Insufficient documentation

## 2014-12-11 LAB — HEMOGLOBIN A1C
Hgb A1c MFr Bld: 5.3 % (ref 4.8–5.6)
MEAN PLASMA GLUCOSE: 105 mg/dL

## 2014-12-11 LAB — VALPROIC ACID LEVEL: VALPROIC ACID LVL: 44 ug/mL — AB (ref 50.0–100.0)

## 2014-12-11 MED ORDER — OLANZAPINE 5 MG PO TABS: 5.0000 mg | ORAL_TABLET | Freq: Every day | ORAL | Status: DC

## 2014-12-11 MED ORDER — TRAZODONE HCL 50 MG PO TABS: 50.0000 mg | ORAL_TABLET | Freq: Every evening | ORAL | Status: DC | PRN

## 2014-12-11 MED ORDER — DIVALPROEX SODIUM ER 500 MG PO TB24: 500.0000 mg | ORAL_TABLET | Freq: Two times a day (BID) | ORAL | Status: DC

## 2014-12-11 MED ORDER — PANTOPRAZOLE SODIUM 20 MG PO TBEC: 20.0000 mg | DELAYED_RELEASE_TABLET | Freq: Every day | ORAL | Status: DC

## 2014-12-11 NOTE — Plan of Care (Signed)
Problem: Diagnosis: Increased Risk For Suicide Attempt Goal: STG-Patient Will Comply With Medication Regime Outcome: Progressing Pt is safe and compliant with PRN and scheduled medication.

## 2014-12-11 NOTE — Progress Notes (Signed)
Patient ID: Clinton Dean, male   DOB: April 14, 1986, 29 y.o.   MRN: [id number]   Pt currently presents with a animated affect and anxious behavior. Per self inventory, pt rates depression at a 0, hopelessness 0 and anxiety 1. Pt's daily goal is to "go home" and they intend to do so by "stay positive." Pt reports good sleep, concentration and a appetite.   Pt provided with medications per providers orders. Pt's labs and vitals were monitored throughout the day. Pt supported emotionally and encouraged to express concerns and questions. Pt educated on  Medications and healthy self care activities.  Pt's safety ensured with 15 minute and environmental checks. Pt currently denies SI/HI and A/V hallucinations. Pt verbally agrees to seek staff if SI/HI or A/VH occurs and to consult with staff before acting on these thoughts. Pt to be discharged today. Pt states "I'm going to get back to playing with the kids and working my butt off."
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
## 2014-12-11 NOTE — Progress Notes (Signed)
Recreation Therapy Notes  Animal-Assisted Activity (AAA) Program Checklist/Progress Notes Patient Eligibility Criteria Checklist & Daily Group note for Rec Tx Intervention  Date: 05.26.16 Time: 2:45pm Location: [address] [employer]   AAA/T Program Assumption of Risk Form signed by Patient/ or Parent Legal Guardian yes  Patient is free of allergies or sever asthma yes  Patient reports no fear of animals yes  Patient reports no history of cruelty to animalsyes  Patient understands his/her participation is voluntary yes  Patient washes hands before animal contact yes  Patient washes hands after animal contact yes  Education: Hand Washing, Appropriate Animal Interaction   Education Outcome: Acknowledges understanding/In group clarification offered/Needs additional education.   Clinical Observations/Feedback: Patient did not attend group.  Victorino Sparrow, LRT/CTRS         Victorino Sparrow A 12/11/2014 4:12 PM

## 2014-12-11 NOTE — Progress Notes (Signed)
Patient ID: Clinton Dean, male   DOB: [birth year], 29 y.o.   MRN: [id number]   Pt discharged home with a buss pass. Pt was stable and appreciative. All papers and prescriptions were given and valuables returned. Verbal understanding expressed. Denies SI/HI and A/VH. Pt given opportunity to express concerns and ask questions.

## 2014-12-11 NOTE — [facility] Suicide Risk Assessment (Addendum)
[facility] Discharge Suicide Risk Assessment   Demographic Factors:  29 year old male, lives with fiance, employed, has two children   Total Time spent with patient: 30 minutes  Musculoskeletal: Strength & Muscle Tone: within normal limits Gait & Station: normal Patient leans: N/A  Psychiatric Specialty Exam: Physical Exam  ROS  Blood pressure 119/76, pulse 67, temperature 97.5 F (36.4 C), temperature source Oral, resp. rate 18, height 5\' 7"  (1.702 m), weight 155 lb (70.308 kg).Body mass index is 24.27 kg/(m^2).  General Appearance: Casual  Eye Contact::  Good  Speech:  Normal Rate409  Volume:  Normal  Mood:  Euthymic  Affect:  Appropriate, reactive   Thought Process:  Goal Directed and Linear  Orientation:  Full (Time, Place, and Person)  Thought Content:  denies hallucinations, no delusions   Suicidal Thoughts:  No  Homicidal Thoughts:  No  Memory:  recent and remote grossly intact   Judgement:  Other:  improved   Insight:  improved   Psychomotor Activity:  Normal  Concentration:  Good  Recall:  Good  Fund of Knowledge:Good  Language: Good  Akathisia:  No  Handed:  Right  AIMS (if indicated):     Assets:  Communication Skills Desire for Improvement Physical Health Resilience Social Support Vocational/Educational  Sleep:  Number of Hours: 5.25  Cognition: WNL  ADL's:  Improved    Have you used any form of tobacco in the last 30 days? (Cigarettes, Smokeless Tobacco, Cigars, and/or Pipes): Yes  Has this patient used any form of tobacco in the last 30 days? (Cigarettes, Smokeless Tobacco, Cigars, and/or Pipes) - counseled regarding benefits of smoking cessation- patient interested in getting script for nicoderm patches upon discharge.   Mental Status Per Nursing Assessment::   On Admission:     Current Mental Status by Physician: At this time patient is improved- he presents euthymic, with appropriate affect, thought process linear, no SI or HI,  No psychotic  symptoms, future oriented, looking forward to seeing his GF and children and returning to work.   Loss Factors: Financial issues, relationship strain. States he has " worked things out and things are better between [place]" at this time.   Historical Factors: States he has a history of Bipolar Disorder , has history of benefiting from Zyprexa and Depakote in the past , with no side effects.   Risk Reduction Factors:   Responsible for children under 51 years of age, Sense of responsibility to family, Living with another person, especially a relative and Positive coping skills or problem solving skills  Continued Clinical Symptoms:  As noted, at this time patient reports much improvement compared to admission, denies SI and presents euthymic- denies medication side effects. Of note, Valproic Acid slightly low ( 44) - we discussed increasing dose, but due to good response and absence of side effects at current dose , we decided to continue current dose at this time.  Cognitive Features That Contribute To Risk:  No gross cognitive deficits noted upon discharge. Is alert , attentive, and oriented x 3   Suicide Risk:  Mild:  Suicidal ideation of limited frequency, intensity, duration, and specificity.  There are no identifiable plans, no associated intent, mild dysphoria and related symptoms, good self-control (both objective and subjective assessment), few other risk factors, and identifiable protective factors, including available and accessible social support.  Principal Problem: Suicide attempt by drug ingestion Discharge Diagnoses:  Patient Active Problem List   Diagnosis Date Noted  . MDD (major depressive disorder),  recurrent severe, without psychosis [F33.2] 12/09/2014  . Suicide attempt by drug ingestion [T50.902A] 12/09/2014  . Bipolar affective disorder, current episode mixed, without psychotic features [F31.60] 12/08/2014  . Cannabis dependence, continuous [F12.20] 12/08/2014     Follow-up Information    Follow up On 01/08/2015.   Why:  January 08, 2015 at 2:30 PM.  Please arrive 67 mintues early to complete registration process   Contact information:   [address] [place], [place]   [address]  [phone]      Plan Of Care/Follow-up recommendations:  Activity:  as tolerated Diet:  Regular Tests:  NA Other:  see below  Is patient on multiple antipsychotic therapies at discharge:  No   Has Patient had three or more failed trials of antipsychotic monotherapy by history:  No  Recommended Plan for Multiple Antipsychotic Therapies: NA  Patient is  leaving in good spirits. There are no current grounds for involuntary commitment.  Plans to return home. Encouraged to abstain from cannabis .    COBOS, [place] 12/11/2014, 10:06 AM

## 2014-12-11 NOTE — Progress Notes (Addendum)
  [facility] Adult Case Management Discharge Plan :  Will you be returning to the same living situation after discharge:  Yes,  Patient is returning to his home. At discharge, do you have transportation home?: Yes,  Patient will arrange transportation home. Do you have the ability to pay for your medications: Yes,  Patient can obtain medications.  Release of information consent forms completed and in the chart;  Patient's signature needed at discharge.  Patient to Follow up at: Follow-up Information    Follow up with Dr. Doyne Keel [facility] On 01/08/2015.   Why:  January 08, 2015 at 2:30 PM.  Please arrive 8 mintues early to complete registration process   Contact information:   [address] [place], [place]   [address]  [phone]      Follow up with Lavinia Sharps On 12/22/2014.   Why:  You are scheduled with Hulen Luster on Monday, December 22, 2014 at [facility] information:   [address], [place]   [address]  [phone]      Patient denies SI/HI: Patient no longer endorsing SI/HI or other thoughts of self harm.    Safety Planning and Suicide Prevention discussed:.Reviewed with all patients during discharge planning group   Have you used any form of tobacco in the last 30 days? (Cigarettes, Smokeless Tobacco, Cigars, and/or Pipes): Yes  Has patient been referred to the Quitline?:  Yes, referral faxed 12/11/14   [name] 12/11/2014, 10:33 AM

## 2014-12-11 NOTE — Progress Notes (Signed)
Patient ID: Clinton Dean, male   DOB: 1986/07/15, 29 y.o.   MRN: [id number] Adult Psychoeducational Group Note  Date:  12/11/2014 Time: 09:15am  Group Topic/Focus:  Orientation:   The focus of this group is to educate the patient on the purpose and policies of crisis stabilization and provide a format to answer questions about their admission.  The group details unit policies and expectations of patients while admitted. Self Care:   The focus of this group is to help patients understand the importance of self-care in order to improve or restore emotional, physical, spiritual, interpersonal, and financial health.  Participation Level:  Active  Participation Quality:  Attentive  Affect:  Appropriate  Cognitive:  Alert and oriented  Insight: Good  Engagement in Group:  Engaged  Modes of Intervention:  Activity, Discussion, Education and Support  Additional Comments: Pt able to identify strengths in self care. Pt identified one healthy aspect of leisure and lifestyle changes to incorporate post discharge.   [name] 12/11/2014, 10:16 AM

## 2015-01-08 ENCOUNTER — Ambulatory Visit (HOSPITAL_COMMUNITY): Payer: Self-pay | Admitting: Psychiatry

## 2015-01-19 ENCOUNTER — Emergency Department (HOSPITAL_COMMUNITY)
Admission: EM | Admit: 2015-01-19 | Discharge: 2015-01-20 | Disposition: A | Discharge status: Other Acute Inpatient Hospital | Payer: BLUE CROSS/BLUE SHIELD | Attending: Emergency Medicine | Admitting: Emergency Medicine

## 2015-01-19 ENCOUNTER — Encounter (HOSPITAL_COMMUNITY): Payer: Self-pay

## 2015-01-19 ENCOUNTER — Ambulatory Visit (HOSPITAL_COMMUNITY)
Admission: AD | Admit: 2015-01-19 | Discharge: 2015-01-19 | Disposition: A | Discharge status: Home / Self Care | Payer: BLUE CROSS/BLUE SHIELD | Attending: Psychiatry | Admitting: Psychiatry

## 2015-01-19 ENCOUNTER — Encounter (HOSPITAL_COMMUNITY): Payer: Self-pay | Admitting: Emergency Medicine

## 2015-01-19 DIAGNOSIS — F319 Bipolar disorder, unspecified: Secondary | ICD-10-CM | POA: Insufficient documentation

## 2015-01-19 DIAGNOSIS — X788XXA Intentional self-harm by other sharp object, initial encounter: Secondary | ICD-10-CM | POA: Insufficient documentation

## 2015-01-19 DIAGNOSIS — F329 Major depressive disorder, single episode, unspecified: Secondary | ICD-10-CM

## 2015-01-19 DIAGNOSIS — Z720 Tobacco use: Secondary | ICD-10-CM | POA: Insufficient documentation

## 2015-01-19 DIAGNOSIS — Y9389 Activity, other specified: Secondary | ICD-10-CM | POA: Insufficient documentation

## 2015-01-19 DIAGNOSIS — R45851 Suicidal ideations: Secondary | ICD-10-CM

## 2015-01-19 DIAGNOSIS — Y9289 Other specified places as the place of occurrence of the external cause: Secondary | ICD-10-CM | POA: Insufficient documentation

## 2015-01-19 DIAGNOSIS — T1491XA Suicide attempt, initial encounter: Secondary | ICD-10-CM | POA: Diagnosis present

## 2015-01-19 DIAGNOSIS — F32A Depression, unspecified: Secondary | ICD-10-CM

## 2015-01-19 DIAGNOSIS — Y998 Other external cause status: Secondary | ICD-10-CM | POA: Insufficient documentation

## 2015-01-19 DIAGNOSIS — S61511A Laceration without foreign body of right wrist, initial encounter: Secondary | ICD-10-CM | POA: Insufficient documentation

## 2015-01-19 DIAGNOSIS — F3163 Bipolar disorder, current episode mixed, severe, without psychotic features: Secondary | ICD-10-CM | POA: Diagnosis present

## 2015-01-19 HISTORY — DX: Bipolar disorder, unspecified: F31.9

## 2015-01-19 LAB — CBC
HCT: 42.6 % (ref 39.0–52.0)
Hemoglobin: 14.3 g/dL (ref 13.0–17.0)
MCH: 27.4 pg (ref 26.0–34.0)
MCHC: 33.6 g/dL (ref 30.0–36.0)
MCV: 81.6 fL (ref 78.0–100.0)
Platelets: 123 10*3/uL — ABNORMAL LOW (ref 150–400)
RBC: 5.22 MIL/uL (ref 4.22–5.81)
RDW: 13.6 % (ref 11.5–15.5)
WBC: 6.6 10*3/uL (ref 4.0–10.5)

## 2015-01-19 LAB — RAPID URINE DRUG SCREEN, HOSP PERFORMED
AMPHETAMINES: NOT DETECTED
BARBITURATES: NOT DETECTED
Benzodiazepines: NOT DETECTED
Cocaine: NOT DETECTED
Opiates: NOT DETECTED
TETRAHYDROCANNABINOL: NOT DETECTED

## 2015-01-19 LAB — COMPREHENSIVE METABOLIC PANEL
ALBUMIN: 4.4 g/dL (ref 3.5–5.0)
ALT: 25 U/L (ref 17–63)
AST: 29 U/L (ref 15–41)
Alkaline Phosphatase: 80 U/L (ref 38–126)
Anion gap: 8 (ref 5–15)
BUN: 22 mg/dL — AB (ref 6–20)
CALCIUM: 9 mg/dL (ref 8.9–10.3)
CO2: 25 mmol/L (ref 22–32)
Chloride: 106 mmol/L (ref 101–111)
Creatinine, Ser: 1.11 mg/dL (ref 0.61–1.24)
GFR calc Af Amer: >60 mL/min (ref >60)
GFR calc non Af Amer: >60 mL/min (ref >60)
Glucose, Bld: 100 mg/dL — ABNORMAL HIGH (ref 65–99)
Potassium: 4.3 mmol/L (ref 3.5–5.1)
SODIUM: 139 mmol/L (ref 135–145)
Total Bilirubin: 0.7 mg/dL (ref 0.3–1.2)
Total Protein: 8.1 g/dL (ref 6.5–8.1)

## 2015-01-19 LAB — ETHANOL

## 2015-01-19 LAB — ACETAMINOPHEN LEVEL: Acetaminophen (Tylenol), Serum: <10 ug/mL — ABNORMAL LOW (ref 10–30)

## 2015-01-19 LAB — SALICYLATE LEVEL

## 2015-01-19 MED ORDER — LORATADINE 10 MG PO TABS
10.0000 mg | ORAL_TABLET | Freq: Every day | ORAL | Status: DC
  Filled 2015-01-19: qty 1

## 2015-01-19 MED ORDER — PANTOPRAZOLE SODIUM 20 MG PO TBEC
20.0000 mg | DELAYED_RELEASE_TABLET | Freq: Every day | ORAL | Status: DC
  Administered 2015-01-20: 20 mg via ORAL
  Filled 2015-01-19 (×2): qty 1

## 2015-01-19 MED ORDER — TRAZODONE HCL 50 MG PO TABS
50.0000 mg | ORAL_TABLET | Freq: Every evening | ORAL | Status: DC | PRN
  Administered 2015-01-19: 50 mg via ORAL
  Filled 2015-01-19: qty 1

## 2015-01-19 MED ORDER — OLANZAPINE 5 MG PO TABS
5.0000 mg | ORAL_TABLET | Freq: Every day | ORAL | Status: DC
  Administered 2015-01-19: 5 mg via ORAL
  Filled 2015-01-19: qty 1

## 2015-01-19 MED ORDER — IBUPROFEN 800 MG PO TABS: 800.0000 mg | ORAL_TABLET | Freq: Four times a day (QID) | ORAL | Status: DC | PRN

## 2015-01-19 MED ORDER — ASPIRIN-ACETAMINOPHEN-CAFFEINE 250-250-65 MG PO TABS
2.0000 | ORAL_TABLET | Freq: Four times a day (QID) | ORAL | Status: DC | PRN
  Filled 2015-01-19: qty 2

## 2015-01-19 MED ORDER — DIVALPROEX SODIUM ER 500 MG PO TB24
500.0000 mg | ORAL_TABLET | Freq: Two times a day (BID) | ORAL | Status: DC
  Administered 2015-01-19: 500 mg via ORAL
  Filled 2015-01-19 (×3): qty 1

## 2015-01-19 NOTE — ED Provider Notes (Signed)
CSN: [id number]     Arrival date & time 01/19/15  1357 History   First MD Initiated Contact with Patient 01/19/15 1415     Chief Complaint  Patient presents with  . Suicidal     (Consider location/radiation/quality/duration/timing/severity/associated sxs/prior Treatment) HPI   50yM brought in by law enforcement with IVC paperwork. Pt cut R wrist last night with razor in attempt to kill himself. "I did die but for some reason came back." Unable to give me exact reasoning as to why he did this. Currently says he is not suicidal or homicidal. Denies hallucinations. Law enforcement reports that found three letters referencing what he would want loves ones to do if he wasn't around.   Past Medical History  Diagnosis Date  . Bipolar 1 disorder    Past Surgical History  Procedure Laterality Date  . Appendectomy     History reviewed. No pertinent family history. History  Substance Use Topics  . Smoking status: Current Every Day Smoker -- 0.25 packs/day    Types: Cigarettes  . Smokeless tobacco: Never Used  . Alcohol Use: Yes     Comment: 2-3 beers a week    Review of Systems  All systems reviewed and negative, other than as noted in HPI.   Allergies  Vicodin  Home Medications   Prior to Admission medications   Medication Sig Start Date End Date Taking? Authorizing Provider  aspirin-acetaminophen-caffeine (EXCEDRIN MIGRAINE) [phone] MG per tablet Take 2 tablets by mouth every 6 (six) hours as needed for headache.   Yes Historical Provider, MD  cetirizine (ZYRTEC) 10 MG tablet Take 10 mg by mouth daily as needed for allergies.   Yes Historical Provider, MD  divalproex (DEPAKOTE ER) 500 MG 24 hr tablet Take 1 tablet (500 mg total) by mouth 2 (two) times daily. For mood stability. 12/11/14  Yes Niel Hummer, NP  ibuprofen (ADVIL,MOTRIN) 200 MG tablet Take 800 mg by mouth every 6 (six) hours as needed for moderate pain.   Yes Historical Provider, MD  OLANZapine (ZYPREXA) 5 MG  tablet Take 1 tablet (5 mg total) by mouth at bedtime. 12/11/14  Yes Niel Hummer, NP  traZODone (DESYREL) 50 MG tablet Take 1 tablet (50 mg total) by mouth at bedtime and may repeat dose one time if needed. 12/11/14  Yes Niel Hummer, NP  pantoprazole (PROTONIX) 20 MG tablet Take 1 tablet (20 mg total) by mouth daily. For reflux. Patient not taking: Reported on 01/19/2015 12/11/14   Niel Hummer, NP   BP 115/72 mmHg  Pulse 93  Temp(Src) 98.2 F (36.8 C) (Oral)  Resp 18  Ht 5\' 6"  (1.676 m)  Wt 164 lb 2 oz (74.447 kg)  BMI 26.50 kg/m2  SpO2 98% Physical Exam  Constitutional: He appears well-developed and well-nourished. No distress.  HENT:  Head: Normocephalic and atraumatic.  Eyes: Conjunctivae are normal. Right eye exhibits no discharge. Left eye exhibits no discharge.  Neck: Neck supple.  Cardiovascular: Normal rate, regular rhythm and normal heart sounds.  Exam reveals no gallop and no friction rub.   No murmur heard. Pulmonary/Chest: Effort normal and breath sounds normal. No respiratory distress.  Abdominal: Soft. He exhibits no distension. There is no tenderness.  Musculoskeletal: He exhibits no edema or tenderness.       Arms: 3cm linear laceration to volar aspect R wrist. Wound base with desicated appearance. No active bleeding. NVI distally.   Neurological: He is alert.  Skin: Skin is warm and dry.  Psychiatric:  Speech clear. Cooperative. Tangential thoughts. Crying at times.   Nursing note and vitals reviewed.   ED Course  Procedures (including critical care time) Labs Review Labs Reviewed  CBC - Abnormal; Notable for the following:    Platelets 123 (*)    All other components within normal limits  COMPREHENSIVE METABOLIC PANEL - Abnormal; Notable for the following:    Glucose, Bld 100 (*)    BUN 22 (*)    All other components within normal limits  ACETAMINOPHEN LEVEL - Abnormal; Notable for the following:    Acetaminophen (Tylenol), Serum <10 (*)    All other  components within normal limits  ETHANOL  SALICYLATE LEVEL  URINE RAPID DRUG SCREEN, HOSP PERFORMED    Imaging Review No results found.   EKG Interpretation None      MDM   Final diagnoses:  Depression  Suicidal intent    28yM with depression. Cut forearm intentionally last night. No active bleeding. With age of wound, will leave open at this point. Should heal fine with local wound care. Medically cleared. Psych disposition.     Virgel Manifold, MD 01/20/15 [phone]

## 2015-01-19 NOTE — Progress Notes (Signed)
Patient was accepted to [facility] by Dr. Tami Ribas for admission bed#303. Call report to [phone].  CSW informed [place] of the updated information and to call if transportation this evening is possible.      Clinton Dean, MSW, LCSW, [place] Clinical Social Worker [phone]

## 2015-01-19 NOTE — BH Assessment (Addendum)
Tele Assessment Note   Clinton Dean is an 29 y.o. male. Pt originally presented voluntarily to [facility] for assessment today. However, pt has since been placed under IVC. Pt was BIB EMS. He has large pieces of tape covering a dressing on his R forearm. Pt unwraps dressing to show a large cut which is parallel to his vein. Pt sts he attempted to kill himself yesterday by cutting his wrist with a straight razor. Pt sts he first numbed his wrist by placing frozen vegetables on it. He says he then cut his wrist. Pt sts he walked around house and then laid down. Pt was alone in his home at the time. Pt sts he almost passed out and that he was dying. He reports it was "like I died and came back". Pt reports 8 prior suicide attempts. Per chart review, pt was admitted to [facility] December 18, 2014 for suicide attempt. Pt is oriented x 4. He displays poor insight and poor impulse control. He goes on to tell rambling story about having relatives text his fiancee that pt died. Pt says he wants fiancee to think he is dead "to make her think more instead of being impulsive all the time." He is unable to clarify or elaborate on statement. Pt is cooperative and tearful. He endorses depressive symptoms including tearfulness, isolating bx, fatigue, guilt, loss of interest in usual pleasures, worthlessness and irritability. He reports stressors including death of his father in late 2014-12-18 and his fiancee has broken up with him twice lately. He reports financial stressors and Pt states that Depakote was helpful in the past. He says he can't afford psych meds so he didn't follow up with [facility] discharge instructions. Pt sts he hasn't slept since yesterday. Pt sts he used to smoke one joint a day for the past 12 years but last use was 12/07/14. Pt currently denies SI and HI. Pt denies [facility] and no delusions noted. Pt doesn't want to go inpatient. He states he wants to go home. He says he is feeling better about his life.  Axis I:  Bipolar  Disorder, current episode mixed            Cannabis Dependence Axis II: Deferred Axis III: History reviewed. No pertinent past medical history. Axis IV: economic problems, housing problems, other psychosocial or environmental problems, problems related to legal system/crime, problems related to social environment and problems with primary support group Axis V: 31-40 impairment in reality testing  Past Medical History: History reviewed. No pertinent past medical history.  History reviewed. No pertinent past surgical history.  Family History: History reviewed. No pertinent family history.  Social History:  reports that he has been smoking Cigarettes.  He has been smoking about 0.50 packs per day. He does not have any smokeless tobacco history on file. He reports that he drinks alcohol. He reports that he uses illicit drugs (Marijuana).  Additional Social History:  Alcohol / Drug Use Pain Medications: pt denies abuse - see PTA meds Prescriptions: pt denies abuse - see PTA meds Over the Counter: pt denies abuse - see PTA meds History of alcohol / drug use?: Yes Longest period of sobriety (when/how long): less than 90 days Negative Consequences of Use: Personal relationships, Work / Automotive engineer #1 Name of Substance 1: marijuana 1 - Age of First Use: 16 1 - Amount (size/oz): one joint 1 - Frequency: daily 1 - Duration: 12 years 1 - Last Use / Amount: 12/07/14  CIWA: CIWA-Ar BP: 119/76  mmHg Pulse Rate: 67 COWS:    PATIENT STRENGTHS: (choose at least two) Active sense of humor Average or above average intelligence Capable of independent living Communication skills General fund of knowledge Physical Health  Allergies:  Allergies  Allergen Reactions  . Vicodin [Hydrocodone-Acetaminophen] Hives and Other (See Comments)    Extreme headache    Home Medications:  No prescriptions prior to admission    OB/GYN Status:  No LMP for male patient.  General Assessment  Data Location of Assessment: [facility] Assessment Services TTS Assessment: In system Is this a Tele or Face-to-Face Assessment?: Face-to-Face Is this an Initial Assessment or a Re-assessment for this encounter?: Initial Assessment Marital status: Long term relationship Living Arrangements: Children, Spouse/significant other (fiancee, their 2 kids (2 & 3)) Can pt return to current living arrangement?: Yes Admission Status: Involuntary Is patient capable of signing voluntary admission?: Yes Referral Source: Self/Family/Friend (fiancee called [employer]) Insurance type: BCBS     Crisis Care Plan Living Arrangements: Children, Spouse/significant other (fiancee, their 2 kids (2 & 3)) Name of Psychiatrist: none Name of Therapist: none  Education Status Is patient currently in school?: No Highest grade of school patient has completed: 23 Name of school: [employer] - trade school Contact person: n/A  Risk to self with the past 6 months Suicidal Ideation: No Has patient been a risk to self within the past 6 months prior to admission? : Yes Suicidal Intent: No Has patient had any suicidal intent within the past 6 months prior to admission? : Yes Is patient at risk for suicide?: Yes Suicidal Plan?:  (pt slit wrist yesterday) Has patient had any suicidal plan within the past 6 months prior to admission? : No Access to Means: Yes Specify Access to Suicidal Means: access to sharps What has been your use of drugs/alcohol within the last 12 months?: pt denies THC use since 12/07/14 Previous Attempts/Gestures: Yes How many times?: 9 Other Self Harm Risks: none Triggers for Past Attempts: Other personal contacts, Unpredictable Intentional Self Injurious Behavior: None Family Suicide History: No Recent stressful life event(s): Other (Comment), Conflict (Comment), Financial Problems, Legal Issues (dad died late 2023-01-05, fiancee broke up w/ him) Persecutory voices/beliefs?: No Depression: Yes Depression Symptoms:  Tearfulness, Insomnia, Isolating, Fatigue, Guilt, Loss of interest in usual pleasures, Feeling worthless/self pity, Feeling angry/irritable Substance abuse history and/or treatment for substance abuse?: Yes Suicide prevention information given to non-admitted patients: Not applicable  Risk to Others within the past 6 months Homicidal Ideation: No Does patient have any lifetime risk of violence toward others beyond the six months prior to admission? : No Thoughts of Harm to Others: No Current Homicidal Intent: No Current Homicidal Plan: No Access to Homicidal Means: No Identified Victim: none History of harm to others?: No Assessment of Violence: None Noted Violent Behavior Description: pt denies hx violence altho has charge for assault Does patient have access to weapons?: Yes (Comment) (pt sts could get a gun ) Criminal Charges Pending?: No Does patient have a court date: No Is patient on probation?: Yes  Psychosis Hallucinations: None noted Delusions: None noted  Mental Status Report Appearance/Hygiene: Unremarkable, Other (Comment) (in street clothes) Eye Contact: Good Motor Activity: Freedom of movement Speech: Logical/coherent Level of Consciousness: Alert Mood: Depressed, Sad Affect: Anxious Anxiety Level: Moderate Thought Processes: Relevant, Coherent Judgement: Unimpaired Orientation: Person, Place, Time, Situation Obsessive Compulsive Thoughts/Behaviors: None  Cognitive Functioning Concentration: Normal Memory: Recent Intact, Remote Intact IQ: Average Insight: Poor Impulse Control: Poor Appetite: Fair Sleep: No Change Total Hours  of Sleep: 7 Vegetative Symptoms: None  ADLScreening [facility] Assessment Services) Patient's cognitive ability adequate to safely complete daily activities?: Yes Patient able to express need for assistance with ADLs?: Yes Independently performs ADLs?: Yes (appropriate for developmental age)  Prior Inpatient Therapy Prior Inpatient  Therapy: Yes Prior Therapy Dates: May 2016 Prior Therapy Facilty/Provider(s): [facility] Reason for Treatment: suicide attempt, bipolar  Prior Outpatient Therapy Prior Outpatient Therapy: Yes Prior Therapy Dates: 2014 Prior Therapy Facilty/Provider(s): [facility] Reason for Treatment: MDD Does patient have an ACCT team?: No Does patient have Intensive In-House Services?  : No Does patient have [facility] services? : Unknown Does patient have P4CC services?: Unknown  ADL Screening (condition at time of admission) Patient's cognitive ability adequate to safely complete daily activities?: Yes Is the patient deaf or have difficulty hearing?: No Does the patient have difficulty seeing, even when wearing glasses/contacts?: No Does the patient have difficulty concentrating, remembering, or making decisions?: No Patient able to express need for assistance with ADLs?: Yes Does the patient have difficulty dressing or bathing?: No Independently performs ADLs?: Yes (appropriate for developmental age) Does the patient have difficulty walking or climbing stairs?: No Weakness of Legs: None Weakness of Arms/Hands: None  Home Assistive Devices/Equipment Home Assistive Devices/Equipment: None  Therapy Consults (therapy consults require a physician order) PT Evaluation Needed: No OT Evalulation Needed: No SLP Evaluation Needed: No Abuse/Neglect Assessment (Assessment to be complete while patient is alone) Physical Abuse: Denies Verbal Abuse: Denies Sexual Abuse: Denies Exploitation of patient/patient's resources: Denies Self-Neglect: Denies Values / Beliefs Cultural Requests During Hospitalization: None Spiritual Requests During Hospitalization: None Consults Spiritual Care Consult Needed: No Social Work Consult Needed: No Regulatory affairs officer (For Healthcare) Does patient have an advance directive?: No Would patient like information on creating an advanced directive?: No - patient  declined information Nutrition Screen- [facility] Adult/WL/AP Patient's home diet: Regular Has the patient recently lost weight without trying?: No Has the patient been eating poorly because of a decreased appetite?: No Malnutrition Screening Tool Score: 0  Additional Information 1:1 In Past 12 Months?: No CIRT Risk: Yes Elopement Risk: No Does patient have medical clearance?: No     Disposition:  Disposition Initial Assessment Completed for this Encounter: Yes Disposition of Patient: Inpatient treatment program Type of inpatient treatment program: Adult (neal mashburn PA-C recommends inpatient)  Layna Roeper P 01/19/2015 12:09 PM

## 2015-01-19 NOTE — ED Notes (Signed)
Patient changed into burgundy scrubs, wanded by security. [facility] department at the bedside.

## 2015-01-19 NOTE — ED Notes (Addendum)
Patient has a laceration to the right wrist area that occurred yesterday. Patient  Has IVC papers stating a danger to himself. Patient denies suicidal ideations today. Patient denies any auditory or visual hallucination or HI. Patient states that he has not taken his new prescriptions because he could not afford them.

## 2015-01-20 ENCOUNTER — Inpatient Hospital Stay
Admit: 2015-01-20 | Discharge: 2015-01-21 | DRG: 885 | Disposition: A | Discharge status: Home / Self Care | Payer: Self-pay | Attending: Psychiatry | Admitting: Psychiatry

## 2015-01-20 DIAGNOSIS — Z9114 Patient's other noncompliance with medication regimen: Secondary | ICD-10-CM | POA: Diagnosis present

## 2015-01-20 DIAGNOSIS — F3163 Bipolar disorder, current episode mixed, severe, without psychotic features: Principal | ICD-10-CM | POA: Diagnosis present

## 2015-01-20 DIAGNOSIS — E785 Hyperlipidemia, unspecified: Secondary | ICD-10-CM | POA: Diagnosis present

## 2015-01-20 DIAGNOSIS — K219 Gastro-esophageal reflux disease without esophagitis: Secondary | ICD-10-CM | POA: Diagnosis present

## 2015-01-20 DIAGNOSIS — S61519A Laceration without foreign body of unspecified wrist, initial encounter: Secondary | ICD-10-CM | POA: Diagnosis present

## 2015-01-20 DIAGNOSIS — X788XXA Intentional self-harm by other sharp object, initial encounter: Secondary | ICD-10-CM | POA: Diagnosis present

## 2015-01-20 DIAGNOSIS — F1220 Cannabis dependence, uncomplicated: Secondary | ICD-10-CM | POA: Diagnosis present

## 2015-01-20 DIAGNOSIS — F17210 Nicotine dependence, cigarettes, uncomplicated: Secondary | ICD-10-CM | POA: Diagnosis present

## 2015-01-20 DIAGNOSIS — Y92009 Unspecified place in unspecified non-institutional (private) residence as the place of occurrence of the external cause: Secondary | ICD-10-CM

## 2015-01-20 DIAGNOSIS — R45851 Suicidal ideations: Secondary | ICD-10-CM | POA: Diagnosis present

## 2015-01-20 HISTORY — DX: Schizophrenia, unspecified: F20.9

## 2015-01-20 MED ORDER — TRAZODONE HCL 50 MG PO TABS
50.0000 mg | ORAL_TABLET | Freq: Every evening | ORAL | Status: DC | PRN
  Administered 2015-01-20: 50 mg via ORAL
  Filled 2015-01-20: qty 1

## 2015-01-20 MED ORDER — PANTOPRAZOLE SODIUM 40 MG PO TBEC
40.0000 mg | DELAYED_RELEASE_TABLET | Freq: Every day | ORAL | Status: DC
  Administered 2015-01-20 – 2015-01-21 (×2): 40 mg via ORAL
  Filled 2015-01-20 (×2): qty 1

## 2015-01-20 MED ORDER — PANTOPRAZOLE SODIUM 20 MG PO TBEC
20.0000 mg | DELAYED_RELEASE_TABLET | Freq: Every day | ORAL | Status: DC
  Filled 2015-01-20: qty 1

## 2015-01-20 MED ORDER — OLANZAPINE 5 MG PO TABS
5.0000 mg | ORAL_TABLET | Freq: Every day | ORAL | Status: DC
  Administered 2015-01-20: 5 mg via ORAL
  Filled 2015-01-20: qty 1

## 2015-01-20 MED ORDER — NICOTINE 14 MG/24HR TD PT24
14.0000 mg | MEDICATED_PATCH | Freq: Every day | TRANSDERMAL | Status: DC
  Filled 2015-01-20: qty 1

## 2015-01-20 MED ORDER — DIVALPROEX SODIUM ER 500 MG PO TB24
500.0000 mg | ORAL_TABLET | Freq: Two times a day (BID) | ORAL | Status: DC
  Administered 2015-01-20 – 2015-01-21 (×3): 500 mg via ORAL
  Filled 2015-01-20 (×3): qty 1

## 2015-01-20 MED ORDER — LORATADINE 10 MG PO TABS
10.0000 mg | ORAL_TABLET | Freq: Every day | ORAL | Status: DC
  Administered 2015-01-20 – 2015-01-21 (×2): 10 mg via ORAL
  Filled 2015-01-20 (×2): qty 1

## 2015-01-20 MED ORDER — MAGNESIUM HYDROXIDE 400 MG/5ML PO SUSP: 30.0000 mL | Freq: Every day | ORAL | Status: DC | PRN

## 2015-01-20 MED ORDER — ALUM & MAG HYDROXIDE-SIMETH 200-200-20 MG/5ML PO SUSP: 30.0000 mL | ORAL | Status: DC | PRN

## 2015-01-20 NOTE — Tx Team (Signed)
Initial Interdisciplinary Treatment Plan   PATIENT STRESSORS: Financial difficulties Loss of Father May 2016 Marital or family conflict   PATIENT STRENGTHS: Capable of independent living Curator fund of knowledge   PROBLEM LIST: Problem List/Patient Goals Date to be addressed Date deferred Reason deferred Estimated date of resolution  Bipolar  01/20/2015      Depression with suicide attempt  01/20/2015                                                DISCHARGE CRITERIA:  Ability to meet basic life and health needs Improved stabilization in mood, thinking, and/or behavior  PRELIMINARY DISCHARGE PLAN: Attend aftercare/continuing care group Return to previous living arrangement  PATIENT/FAMIILY INVOLVEMENT: This treatment plan has been presented to and reviewed with the patient, Clinton Dean, and/or family member, .  The patient and family have been given the opportunity to ask questions and make suggestions.  Clinton Dean 01/20/2015, 3:47 PM

## 2015-01-20 NOTE — [facility] Group Notes (Signed)
[place] Group Notes:  (Nursing/MHT/Case Management/Adjunct)  Date:  01/20/2015  Time:  9:39 PM  Type of Therapy:  Group Therapy  Participation Level:  Active  Participation Quality:  Appropriate  Affect:  Appropriate  Cognitive:  Alert and Appropriate  Insight:  Appropriate and Good  Engagement in Group:  Engaged  Modes of Intervention:  Discussion  Summary of Progress/Problems:  Clinton Dean 01/20/2015, 9:39 PM
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
## 2015-01-20 NOTE — Progress Notes (Signed)
Patient alert and oriented x 4. Sad affect and cooperative behavior with admission assessment and interview. Skin check and belonging check performed with no contraband found by Probation officer. Patient with self inflicted open area to right forearm covered with bandaids. Scant amount of drainage to bandaid, no redness, no swelling. No discomfort. Patient denies SI/HI/SH/AVH at this time. Slightly anxious and states he "does not belong here, that the paramedic turned his words around and that he is better and needs to find a job and discharge home". Orient to unit and room. Safety maintained. Encouraged therapy groups to learn coping skills. States his stressors as financial and that his Father died in Jan 07, 2023. Safety maintained at this time.

## 2015-01-20 NOTE — ED Notes (Addendum)
Spoke with social work. [facility] told social work that pt does not have bed and that they are waiting on discharges.

## 2015-01-20 NOTE — Plan of Care (Signed)
Problem: Ineffective individual coping Goal: LTG: Patient will report a decrease in negative feelings Outcome: Progressing Patient cooperative with admission interview and orientation to unit. Denies SI/HI/AVH/SH at this time.

## 2015-01-20 NOTE — Progress Notes (Signed)
Patient ID: Clinton Dean, male   DOB: [birth year], 29 y.o.   MRN: [id number]

## 2015-01-20 NOTE — [facility] Suicide Risk Assessment (Signed)
[facility] Admission Suicide Risk Assessment   Nursing information obtained from:    Demographic factors:    Current Mental Status:    Loss Factors:    Historical Factors:    Risk Reduction Factors:    Total Time spent with patient: 1 hour Principal Problem: Bipolar affective disorder, current episode mixed, without psychotic features Diagnosis:   Patient Active Problem List   Diagnosis Date Noted  . Bipolar 1 disorder, mixed, moderate [F31.62] 01/20/2015  . Suicide attempt [T14.91] 01/19/2015  . Cannabis dependence [F12.20]   . MDD (major depressive disorder), recurrent severe, without psychosis [F33.2] 12/09/2014  . Suicide attempt by drug ingestion [T50.902A] 12/09/2014  . Bipolar affective disorder, current episode mixed, without psychotic features [F31.60] 12/08/2014  . Cannabis dependence, continuous [F12.20] 12/08/2014     Continued Clinical Symptoms:  Alcohol Use Disorder Identification Test Final Score (AUDIT): 2 The "Alcohol Use Disorders Identification Test", Guidelines for Use in Primary Care, Second Edition.  World Pharmacologist [facility]). Score between 0-7:  no or low risk or alcohol related problems. Score between 8-15:  moderate risk of alcohol related problems. Score between 16-19:  high risk of alcohol related problems. Score 20 or above:  warrants further diagnostic evaluation for alcohol dependence and treatment.   CLINICAL FACTORS:   Bipolar Disorder:   Mixed State   Musculoskeletal: Strength & Muscle Tone: within normal limits Gait & Station: normal Patient leans: N/A  Psychiatric Specialty Exam: Physical Exam  Nursing note and vitals reviewed. Constitutional: He is oriented to person, place, and time. He appears well-developed and well-nourished.  HENT:  Head: Normocephalic and atraumatic.  Eyes: Conjunctivae are normal. Pupils are equal, round, and reactive to light.  Neck: Normal range of motion. Neck supple.  Cardiovascular: Normal rate and  regular rhythm.   Respiratory: Effort normal and breath sounds normal.  GI: Soft. Bowel sounds are normal.  Musculoskeletal: Normal range of motion.  Neurological: He is alert and oriented to person, place, and time.  Skin: Skin is warm and dry.    Review of Systems  Gastrointestinal: Positive for heartburn.  All other systems reviewed and are negative.   Blood pressure 113/75, pulse 82, temperature 98.6 F (37 C), temperature source Oral, resp. rate 18, height 5\' 6"  (1.676 m), weight 67.132 kg (148 lb), SpO2 98 %.Body mass index is 23.9 kg/(m^2).  General Appearance: Casual  Eye Contact::  Fair  Speech:  Clear and Coherent  Volume:  Normal  Mood:  Depressed  Affect:  Labile  Thought Process:  Linear  Orientation:  Full (Time, Place, and Person)  Thought Content:  WDL  Suicidal Thoughts:  Yes.  without intent/plan  Homicidal Thoughts:  No  Memory:  Immediate;   Fair Recent;   Fair Remote;   Fair  Judgement:  Fair  Insight:  Fair  Psychomotor Activity:  Normal  Concentration:  Fair  Recall:  [employer] [place]  Language: Fair  Akathisia:  No  Handed:  Right  AIMS (if indicated):     Assets:  Communication Skills Desire for Improvement Financial Resources/Insurance Housing Intimacy Physical Health Social Support  Sleep:     Cognition: WNL  ADL's:  Intact     COGNITIVE FEATURES THAT CONTRIBUTE TO RISK:  None    SUICIDE RISK:   Moderate:  Frequent suicidal ideation with limited intensity, and duration, some specificity in terms of plans, no associated intent, good self-control, limited dysphoria/symptomatology, some risk factors present, and identifiable protective factors, including available and accessible  social support.  PLAN OF CARE: Hospital admission, medication management, discharge planning.  Medical Decision Making:  New problem, with additional work up planned, Review of Psycho-Social Stressors (1), Review or order clinical lab tests (1),  Review of Medication Regimen & Side Effects (2) and Review of New Medication or Change in Dosage (2)   Mr. Josetta Huddle is a 29 year old male with a history of bipolar disorder admitted after an overdose on multiple over-the-counter medication in the context of social stressors and treatment noncompliance.  1. Suicidal ideation. The patient is able to contract for safety.  2. Mood. The patient in the past responded well to a combination of Depakote and Zyprexa. He was started on Zyprexa 5 mg and Depakote 500 mg twice daily at [facility] already. We will continue. Will check Depakote level and for signs of metabolic syndrome. The patient has no insurance and the cost of medication is the problem.  3. Insomnia. He will be offered trazodone.  4. Smoking. Nicotine products aren't available.  5. Disposition. He will be discharged to home home with his fianc. He will follow up with [facility].   I certify that inpatient services furnished can reasonably be expected to improve the patient's condition.   Jolanta Pucilowska 01/20/2015, 2:42 PM

## 2015-01-20 NOTE — Progress Notes (Signed)
CM spoke with pt who confirms self pay [facility] resident with no pcp.  CM discussed and provided written information for self pay pcps, discussed the importance of pcp vs EDP services for f/u care, www.needymeds.org, www.goodrx.com, discounted pharmacies and other [employer] such as [employer] [facility] [employer], affordable care act,  [place] med assist, financial assistance, self pay dental services, [facility] med assist, DSS and  health department  Reviewed resources for [employer] self pay pcps like [facility] Blossom, family medicine at [employer], [facility], [facility], local urgent care centers, [facility], [facility] family practice, general medical clinics, family services of the [place], [facility] urgent care plus others, medication resources, [facility] out patient pharmacies and housing Pt voiced understanding and appreciation of resources provided   Provided [facility] contact information Pt agreed to a referral Cm completed referral Pt to be contact by [facility] clinical liason Pt informs Cm he is attempting to stay calm because he is upset that someone stated he was doing "drugs but my urine test don't show it"  Pt states "when I left here the last time I got a counselor and I'm taking my medicines"  "I am talking well and understand everything"

## 2015-01-20 NOTE — ED Notes (Signed)
Case manager at bedside 

## 2015-01-20 NOTE — ED Notes (Signed)
Pt upset he has to go to [employer]. Pt states he will get agitated and aggressive if he is not discharged. Pt wants to speak with psychiatry. Psychiatry still in rounds. Informed social work who will inform psychiatry when they return from rounds. Pt refusing medication until he speaks with psychiatry. Pt IVC

## 2015-01-20 NOTE — Progress Notes (Signed)
D: Pt is wake and activ ein the milieu this evening. Pt mood is labile/tearful and his affect is sad. He is hyper verbal and attention seeking. Pt denies SI/HI and AVH and states that the facts surrounding his IVC are mistaken. Pt denies abusing THC and states that he was taking medications as prescribed. Pt reports that he was not trying to commit suicide but he did want his girl friend to think he was dead in order to manipulate her in some way.   A: Writer provided emotional support and encouraged him to utilize this time to make plans for transitioning back to his daily life.   R: Pt has poor insight, blames others and holds the belief that he can utilize a Chief Executive Officer to overturn his IVC because everyone is covering up the truth.

## 2015-01-21 MED ORDER — DIVALPROEX SODIUM 500 MG PO DR TAB: 500.0000 mg | DELAYED_RELEASE_TABLET | Freq: Two times a day (BID) | ORAL | Status: DC

## 2015-01-21 MED ORDER — OLANZAPINE 5 MG PO TABS: 5.0000 mg | ORAL_TABLET | Freq: Every day | ORAL | Status: DC

## 2015-01-21 MED ORDER — TRAZODONE HCL 50 MG PO TABS: 50.0000 mg | ORAL_TABLET | Freq: Every evening | ORAL | Status: DC | PRN

## 2015-01-21 NOTE — Plan of Care (Signed)
Problem: Alteration in mood Goal: STG-Patient is able to discuss feelings and issues (Patient is able to discuss feelings and issues leading to depression)  Outcome: Progressing Pt freely discusses his feelings and life circumstances.

## 2015-01-21 NOTE — Progress Notes (Signed)
  [facility] Adult Case Management Discharge Plan :  Will you be returning to the same living situation after discharge:  Yes,  patient will return home to his previous living situation At discharge, do you have transportation home?: No. patient is provided PART bus fare to [place] and a [place] ticket for transportation from [place] bus station to patient's home Do you have the ability to pay for your medications: Yes,  patient is connect to [place] of information consent forms completed and in the chart;  Patient's signature needed at discharge.  Patient to Follow up at: Follow-up Information    Follow up with [facility]. Go in 2 days.   Why:  For follow-up care; hospital followup appt Friday 01/23/15 at 8am; [facility] walk in hours are M-F 8am-3pm   Contact information:   [address], [place] Ph [phone] Fax [phone]      Patient denies SI/HI: Yes,  patient denies SI/HI    Safety Planning and Suicide Prevention discussed: Yes,  SPE discussed with patient and several attempts to call patient's fiance Leta Baptist [phone] were made with no success  Have you used any form of tobacco in the last 30 days? (Cigarettes, Smokeless Tobacco, Cigars, and/or Pipes): Yes  Has patient been referred to the Quitline?: Patient refused referral  Keene Breath, MSW, LCSWA 01/21/2015, 11:53 AM

## 2015-01-21 NOTE — BHH Suicide Risk Assessment (Signed)
[place] INPATIENT:  Family/Significant Other Suicide Prevention Education  Suicide Prevention Education:  Contact Attempts: Leta Baptist (fiance) [phone] has been identified by the patient as the family member/significant other with whom the patient will be residing, and identified as the person(s) who will aid the patient in the event of a mental health crisis.  With written consent from the patient, two attempts were made to provide suicide prevention education, prior to and/or following the patient's discharge.  We were unsuccessful in providing suicide prevention education.  A suicide education pamphlet was given to the patient to share with family/significant other.  Date and time of first attempt: 01/21/15 11:00am Date and time of second attempt: 01/21/15 11:50am  Carmell Austria T, MSW, LCSWA 01/21/2015, 11:51 AM

## 2015-01-21 NOTE — BHH Suicide Risk Assessment (Signed)
[facility] Discharge Suicide Risk Assessment   Demographic Factors:  Male and Living alone  Total Time spent with patient: 30 minutes  Musculoskeletal: Strength & Muscle Tone: within normal limits Gait & Station: normal Patient leans: N/A  Psychiatric Specialty Exam: Physical Exam  Nursing note and vitals reviewed.   Review of Systems  All other systems reviewed and are negative.   Blood pressure 128/77, pulse 69, temperature 98 F (36.7 C), temperature source Oral, resp. rate 18, height 5\' 6"  (1.676 m), weight 67.132 kg (148 lb), SpO2 100 %.Body mass index is 23.9 kg/(m^2).  General Appearance: Casual  Eye Contact::  Good  Speech:  Clear and [id number]  Volume:  Normal  Mood:  Euthymic  Affect:  Appropriate  Thought Process:  Goal Directed  Orientation:  Full (Time, Place, and Person)  Thought Content:  WDL  Suicidal Thoughts:  No  Homicidal Thoughts:  No  Memory:  Immediate;   Fair Recent;   Fair Remote;   Fair  Judgement:  Fair  Insight:  Fair  Psychomotor Activity:  Normal  Concentration:  Fair  Recall:  [employer] [place]  Language: Fair  Akathisia:  No  Handed:  Right  AIMS (if indicated):     Assets:  Communication Skills Desire for Improvement Financial Resources/Insurance Housing Physical Health Resilience Social Support  Sleep:  Number of Hours: 5  Cognition: WNL  ADL's:  Intact   Have you used any form of tobacco in the last 30 days? (Cigarettes, Smokeless Tobacco, Cigars, and/or Pipes): Yes  Has this patient used any form of tobacco in the last 30 days? (Cigarettes, Smokeless Tobacco, Cigars, and/or Pipes) Yes, A prescription for an FDA-approved tobacco cessation medication was offered at discharge and the patient refused  Mental Status Per Nursing Assessment::   On Admission:     Current Mental Status by Physician: NA  Loss Factors: Decrease in vocational status, Loss of significant relationship, Legal issues and Financial  problems/change in socioeconomic status  Historical Factors: Prior suicide attempts and Impulsivity  Risk Reduction Factors:   Responsible for children under 79 years of age, Sense of responsibility to family and Positive social support  Continued Clinical Symptoms:  Bipolar Disorder:   Depressive phase  Cognitive Features That Contribute To Risk:  None    Suicide Risk:  Minimal: No identifiable suicidal ideation.  Patients presenting with no risk factors but with morbid ruminations; may be classified as minimal risk based on the severity of the depressive symptoms  Principal Problem: Bipolar 1 disorder, mixed, severe Discharge Diagnoses:  Patient Active Problem List   Diagnosis Date Noted  . Suicide attempt [T14.91] 01/19/2015  . Cannabis dependence [F12.20]   . MDD (major depressive disorder), recurrent severe, without psychosis [F33.2] 12/09/2014  . Suicide attempt by drug ingestion [T50.902A] 12/09/2014  . Bipolar 1 disorder, mixed, severe [F31.63] 12/08/2014  . Cannabis dependence, continuous [F12.20] 12/08/2014      Plan Of Care/Follow-up recommendations:  Activity:  as tolerated. Diet:  Regular. Other:  Keep follow up appointments.  Is patient on multiple antipsychotic therapies at discharge:  No   Has Patient had three or more failed trials of antipsychotic monotherapy by history:  No  Recommended Plan for Multiple Antipsychotic Therapies: NA    Clinton Dean 01/21/2015, 9:44 AM

## 2015-01-21 NOTE — Progress Notes (Signed)
Pt discharged home. DC instructions provided and explained. Medications reviewed. Rx given. All questions answered. Pt stable at discharge. Denies SI, HI, AVH. 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

## 2015-01-21 NOTE — BHH Group Notes (Signed)
[facility] LCSW Aftercare Discharge Planning Group Note  01/21/2015 10:11 AM  Participation Quality:  Appropriate and Attentive  Affect:  Appropriate  Cognitive:  Alert, Appropriate and Oriented  Insight:  Engaged  Engagement in Group:  Engaged  Modes of Intervention:  Education, Socialization and Support  Summary of Progress/Problems: Patient attended and participated in group discussion appropriately. Patient shared that his SMART goal is to "continue taking my meds, self care to get back to where I use to be".   Keene Breath, MSW, LCSWA 01/21/2015, 10:11 AM

## 2015-01-21 NOTE — BHH Group Notes (Signed)
[facility] Group Notes:  (Nursing/MHT/Case Management/Adjunct)  Date:  01/21/2015  Time:  12:08 PM  Type of Therapy:  Group Therapy  Participation Level:  Active  Participation Quality:  Appropriate  Affect:  Appropriate  Cognitive:  Appropriate  Insight:  Good  Engagement in Group:  Engaged  Modes of Intervention:  Activity  Summary of Progress/Problems:  Clinton Dean 01/21/2015, 12:08 PM
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
## 2015-01-22 NOTE — Progress Notes (Signed)
[facility] H&P Discharge Summary faxed to [facility] for hospital follow-up °

## 2016-10-19 ENCOUNTER — Emergency Department (HOSPITAL_COMMUNITY)
Admission: EM | Admit: 2016-10-19 | Discharge: 2016-10-20 | Disposition: A | Discharge status: Home / Self Care | Payer: Self-pay

## 2016-10-19 ENCOUNTER — Encounter (HOSPITAL_COMMUNITY): Payer: Self-pay | Admitting: Emergency Medicine

## 2016-10-19 DIAGNOSIS — Z202 Contact with and (suspected) exposure to infections with a predominantly sexual mode of transmission: Secondary | ICD-10-CM | POA: Insufficient documentation

## 2016-10-19 DIAGNOSIS — F17210 Nicotine dependence, cigarettes, uncomplicated: Secondary | ICD-10-CM | POA: Insufficient documentation

## 2016-10-19 DIAGNOSIS — Z79899 Other long term (current) drug therapy: Secondary | ICD-10-CM | POA: Insufficient documentation

## 2016-10-19 MED ORDER — CEFTRIAXONE SODIUM 250 MG IJ SOLR
250.0000 mg | Freq: Once | INTRAMUSCULAR | Status: AC
Start: 2016-10-20 — End: 2016-10-20
  Administered 2016-10-20: 250 mg via INTRAMUSCULAR
  Filled 2016-10-19: qty 250

## 2016-10-19 MED ORDER — AZITHROMYCIN 250 MG PO TABS
1000.0000 mg | ORAL_TABLET | Freq: Once | ORAL | Status: AC
Start: 2016-10-20 — End: 2016-10-20
  Administered 2016-10-20: 1000 mg via ORAL
  Filled 2016-10-19: qty 4

## 2016-10-19 NOTE — ED Notes (Signed)
PA at bedside.

## 2016-10-19 NOTE — ED Provider Notes (Signed)
[place] DEPT Provider Note   CSN: [id number] Arrival date & time: 10/19/16  2211   By signing my name below, I, Delton Prairie, attest that this documentation has been prepared under the direction and in the presence of  Domenic Moras, PA-C. Electronically Signed: Delton Prairie, ED Scribe. 10/19/16. 11:17 PM.   History   Chief Complaint Chief Complaint  Patient presents with  . Exposure to STD    HPI Comments:  Clinton Dean is a 31 y.o. male who presents to the Emergency Department requesting to be tested for STDs after exposure a few days ago. Pt states his sexual partner tested positive for chlamydia about a week ago. He notes a hx of STD. No alleviating factors noted. Pt denies fevers, abdominal pain, difficulty urinating, dysuria, hematuria, penile discharge, testicular pain, back pain, rash or any other associated symptoms. No drug allergies noted. No other complaints noted.   The history is provided by the patient. No language interpreter was used.    Past Medical History:  Diagnosis Date  . Bipolar 1 disorder ([place])   . Schizophrenia [facility])     Patient Active Problem List   Diagnosis Date Noted  . Suicide attempt 01/19/2015  . Cannabis dependence ([place])   . MDD (major depressive disorder), recurrent severe, without psychosis ([place]) 12/09/2014  . Suicide attempt by drug ingestion ([place]) 12/09/2014  . Bipolar 1 disorder, mixed, severe ([place]) 12/08/2014  . Cannabis dependence, continuous ([place]) 12/08/2014    Past Surgical History:  Procedure Laterality Date  . APPENDECTOMY         Home Medications    Prior to Admission medications   Medication Sig Start Date End Date Taking? Authorizing Provider  cetirizine (ZYRTEC) 10 MG tablet Take 10 mg by mouth daily as needed for allergies.    Historical Provider, MD  divalproex (DEPAKOTE) 500 MG DR tablet Take 1 tablet (500 mg total) by mouth every 12 (twelve) hours. 01/21/15   Clovis Fredrickson, MD  ibuprofen (ADVIL,MOTRIN)  200 MG tablet Take 800 mg by mouth every 6 (six) hours as needed for moderate pain.    Historical Provider, MD  OLANZapine (ZYPREXA) 5 MG tablet Take 1 tablet (5 mg total) by mouth at bedtime. 01/21/15   Clovis Fredrickson, MD  pantoprazole (PROTONIX) 20 MG tablet Take 1 tablet (20 mg total) by mouth daily. For reflux. Patient not taking: Reported on 01/19/2015 12/11/14   Niel Hummer, NP  traZODone (DESYREL) 50 MG tablet Take 1 tablet (50 mg total) by mouth at bedtime and may repeat dose one time if needed. 01/21/15   Clovis Fredrickson, MD    Family History No family history on file.  Social History Social History  Substance Use Topics  . Smoking status: Current Every Day Smoker    Packs/day: 0.25    Types: Cigarettes  . Smokeless tobacco: Never Used  . Alcohol use Yes     Comment: 2-3 beers a week     Allergies   Vicodin [hydrocodone-acetaminophen]   Review of Systems Review of Systems  Constitutional: Negative for fever.  Gastrointestinal: Negative for abdominal pain.  Genitourinary: Negative for difficulty urinating, discharge, dysuria, hematuria and testicular pain.  Musculoskeletal: Negative for back pain.  Skin: Negative for rash.     Physical Exam Updated Vital Signs BP 140/85 (BP Location: Right Arm)   Pulse 82   Temp 98.7 F (37.1 C) (Oral)   Resp 16   Ht 5\' 9"  (1.753 m)   Wt 184 lb  8 oz (83.7 kg)   SpO2 100%   BMI 27.25 kg/m   Physical Exam  Constitutional: He is oriented to person, place, and time. He appears well-developed and well-nourished. No distress.  HENT:  Head: Normocephalic and atraumatic.  Eyes: Conjunctivae are normal.  Cardiovascular: Normal rate.   Pulmonary/Chest: Effort normal.  Abdominal: He exhibits no distension.  Genitourinary:  Genitourinary Comments: Chaperone present during exam.  No inguinal lymphadenopathy or hernia noted, normal circumcised penis free of lesion or rash, no penile discharge, testicle non tender, normal lie,  scrotum soft, perineal soft.    Neurological: He is alert and oriented to person, place, and time.  Skin: Skin is warm and dry.  Psychiatric: He has a normal mood and affect.  Nursing note and vitals reviewed.    ED Treatments / Results  DIAGNOSTIC STUDIES:  Oxygen Saturation is 100% on RA, normal by my interpretation.    COORDINATION OF CARE:  11:15 PM Discussed treatment plan with pt at bedside and pt agreed to plan.  Labs (all labs ordered are listed, but only abnormal results are displayed) Labs Reviewed  RAPID HIV SCREEN (HIV 1/2 AB+AG)  RPR  GC/CHLAMYDIA PROBE AMP ([place]) NOT AT [facility]    EKG  EKG Interpretation None       Radiology No results found.  Procedures Procedures (including critical care time)  Medications Ordered in ED Medications - No data to display   Initial Impression / Assessment and Plan / ED Course  I have reviewed the triage vital signs and the nursing notes.  Pertinent labs & imaging results that were available during my care of the patient were reviewed by me and considered in my medical decision making (see chart for details).     Patient treated in the ED for STI with rocephin/zithromax due to recent exposure to chlamydia. Patient advised to inform and treat all sexual partners.  Pt advised on safe sex practices and understands that they have GC/Chlamydia cultures pending and will result in 2-3 days. HIV and RPR sent. Pt encouraged to follow up at local health department for future STI checks. No concern for prostatitis or epididymitis. Discussed return precautions. Pt appears safe for discharge.   Final Clinical Impressions(s) / ED Diagnoses   Final diagnoses:  STD exposure    New Prescriptions New Prescriptions   No medications on file  I personally performed the services described in this documentation, which was scribed in my presence. The recorded information has been reviewed and is accurate.       Domenic Moras,  PA-C 10/20/16 [place], DO 10/20/16 [id number]

## 2016-10-19 NOTE — ED Triage Notes (Signed)
Patient requesting STD screening reports sexual partner was diagnosed with chlamydia infection , denies any symptoms , no penile discharge , itching or skin lesions .

## 2016-10-20 LAB — RAPID HIV SCREEN (HIV 1/2 AB+AG)
HIV 1/2 Antibodies: NONREACTIVE
HIV-1 P24 Antigen - HIV24: NONREACTIVE

## 2016-10-20 LAB — RPR: RPR: NONREACTIVE

## 2016-10-20 LAB — GC/CHLAMYDIA PROBE AMP (~~LOC~~) NOT AT ARMC
Chlamydia: NEGATIVE
Neisseria Gonorrhea: NEGATIVE

## 2016-10-20 MED ORDER — STERILE WATER FOR INJECTION IJ SOLN
INTRAMUSCULAR | Status: AC
  Filled 2016-10-20: qty 10

## 2016-12-10 DIAGNOSIS — M25561 Pain in right knee: Secondary | ICD-10-CM | POA: Insufficient documentation

## 2016-12-11 ENCOUNTER — Encounter (HOSPITAL_COMMUNITY): Payer: Self-pay

## 2016-12-11 ENCOUNTER — Emergency Department (HOSPITAL_COMMUNITY): Payer: Self-pay

## 2016-12-11 ENCOUNTER — Emergency Department (HOSPITAL_COMMUNITY)
Admission: EM | Admit: 2016-12-11 | Discharge: 2016-12-11 | Discharge status: Against Medical Advice | Payer: Self-pay | Attending: Dermatology | Admitting: Dermatology

## 2016-12-11 NOTE — ED Notes (Signed)
Pt left AMA °

## 2016-12-11 NOTE — ED Triage Notes (Signed)
Pt c/o knee pain for about a week. Pt was at work kneeling down and felt pain go through knee.  Pt has hx of mcl tear, didn't receive surgery. Swelling on right knee.

## 2016-12-15 ENCOUNTER — Encounter (HOSPITAL_COMMUNITY): Payer: Self-pay

## 2016-12-15 ENCOUNTER — Emergency Department (HOSPITAL_COMMUNITY)
Admission: EM | Admit: 2016-12-15 | Discharge: 2016-12-16 | Disposition: A | Discharge status: Home / Self Care | Payer: Self-pay | Attending: Emergency Medicine | Admitting: Emergency Medicine

## 2016-12-15 DIAGNOSIS — Z79899 Other long term (current) drug therapy: Secondary | ICD-10-CM | POA: Insufficient documentation

## 2016-12-15 DIAGNOSIS — M25561 Pain in right knee: Secondary | ICD-10-CM | POA: Insufficient documentation

## 2016-12-15 DIAGNOSIS — G8929 Other chronic pain: Secondary | ICD-10-CM | POA: Insufficient documentation

## 2016-12-15 DIAGNOSIS — F17210 Nicotine dependence, cigarettes, uncomplicated: Secondary | ICD-10-CM | POA: Insufficient documentation

## 2016-12-15 NOTE — ED Triage Notes (Signed)
Pt states he had MCL tear when he was 81 and never received surgery for it; pt states chronic pain in need but pain has increased with slight swelling; pt state he went to [facility] on Saturday and had xray but he left due to high wait times; Pt c/o pain 7/10 on arrival. Pt able to ambulate to triage;

## 2016-12-16 MED ORDER — MELOXICAM 7.5 MG PO TABS
7.5000 mg | ORAL_TABLET | Freq: Once | ORAL | Status: AC
  Administered 2016-12-16: 7.5 mg via ORAL
  Filled 2016-12-16: qty 1

## 2016-12-16 MED ORDER — MELOXICAM 7.5 MG PO TABS: 7.5000 mg | ORAL_TABLET | Freq: Every day | ORAL | 0 refills | Status: DC

## 2016-12-16 MED ORDER — CYCLOBENZAPRINE HCL 10 MG PO TABS: 10.0000 mg | ORAL_TABLET | Freq: Two times a day (BID) | ORAL | 0 refills | Status: DC | PRN

## 2016-12-16 NOTE — Discharge Instructions (Signed)
You can call [facility] to get established with a primary care provider for follow up. You can call Dr. Carlean Jews office for follow-up for your knee. Please do not take the Flexeril if he were going to work or driving because it can make him more sleepy. If he develops new or worsening symptoms including fever, pain, chills, warmth or redness to the knee, please return to the emergency department for reevaluation.

## 2016-12-16 NOTE — ED Provider Notes (Signed)
[place] DEPT Provider Note   CSN: [id number] Arrival date & time: 12/15/16  2228     History   Chief Complaint Chief Complaint  Patient presents with  . Knee Pain    HPI Clinton Dean is a 31 y.o. male with a h/o of an MCL tear to the right knee presents to the Emergency Department with constant pain and swelling to the right knee x1 week that began suddenly after kneeling down at work. He reports he played football all through high school and was diagnosed with an MCL tear, which he states was never repaired. He has been ambulatory since onset. Treatment PTA includes anti-inflammatory medication and ice with minimal improvement. He reports he presented to the [facility] ED on 5/27 and a X-ray was performed, but he left without being seen due to long wait times. Denies fever, chills, left knee pain, or bilateral hip or ankle pain. No h/o of gout, or STIs.   The history is provided by the patient. No language interpreter was used.    Past Medical History:  Diagnosis Date  . Bipolar 1 disorder ([place])   . Schizophrenia [facility])     Patient Active Problem List   Diagnosis Date Noted  . Suicide attempt ([place]) 01/19/2015  . Cannabis dependence ([place])   . MDD (major depressive disorder), recurrent severe, without psychosis ([place]) 12/09/2014  . Suicide attempt by drug ingestion ([place]) 12/09/2014  . Bipolar 1 disorder, mixed, severe ([place]) 12/08/2014  . Cannabis dependence, continuous ([place]) 12/08/2014    Past Surgical History:  Procedure Laterality Date  . APPENDECTOMY         Home Medications    Prior to Admission medications   Medication Sig Start Date End Date Taking? Authorizing Provider  cetirizine (ZYRTEC) 10 MG tablet Take 10 mg by mouth daily as needed for allergies.    [provider]  cyclobenzaprine (FLEXERIL) 10 MG tablet Take 1 tablet (10 mg total) by mouth 2 (two) times daily as needed for muscle spasms. 12/16/16   Casper Pagliuca A, PA-C  divalproex  (DEPAKOTE) 500 MG DR tablet Take 1 tablet (500 mg total) by mouth every 12 (twelve) hours. 01/21/15   Pucilowska, Jolanta B, MD  ibuprofen (ADVIL,MOTRIN) 200 MG tablet Take 800 mg by mouth every 6 (six) hours as needed for moderate pain.    [provider]  meloxicam (MOBIC) 7.5 MG tablet Take 1 tablet (7.5 mg total) by mouth daily. 12/16/16   Anastashia Westerfeld A, PA-C  OLANZapine (ZYPREXA) 5 MG tablet Take 1 tablet (5 mg total) by mouth at bedtime. 01/21/15   Pucilowska, Jolanta B, MD  pantoprazole (PROTONIX) 20 MG tablet Take 1 tablet (20 mg total) by mouth daily. For reflux. Patient not taking: Reported on 01/19/2015 12/11/14   Niel Hummer, NP  traZODone (DESYREL) 50 MG tablet Take 1 tablet (50 mg total) by mouth at bedtime and may repeat dose one time if needed. 01/21/15   Clovis Fredrickson, MD    Family History No family history on file.  Social History Social History  Substance Use Topics  . Smoking status: Current Every Day Smoker    Packs/day: 0.25    Types: Cigarettes  . Smokeless tobacco: Never Used  . Alcohol use Yes     Comment: 2-3 beers a week     Allergies   Vicodin [hydrocodone-acetaminophen]   Review of Systems Review of Systems  Constitutional: Negative for chills and fever.  HENT: Negative for ear pain and sore  throat.   Eyes: Negative for pain and visual disturbance.  Respiratory: Negative for cough and shortness of breath.   Cardiovascular: Negative for chest pain and palpitations.  Gastrointestinal: Negative for abdominal pain, diarrhea, nausea and vomiting.  Genitourinary: Negative for dysuria and hematuria.  Musculoskeletal: Positive for arthralgias, gait problem and myalgias. Negative for back pain.  Skin: Negative for color change and rash.  Neurological: Negative for seizures and syncope.  All other systems reviewed and are negative.   Physical Exam Updated Vital Signs BP 127/69 (BP Location: Right Arm)   Pulse 70   Temp 98.5 F (36.9 C)  (Oral)   Resp 15   SpO2 99%   Physical Exam  Constitutional: He appears well-developed and well-nourished.  HENT:  Head: Normocephalic and atraumatic.  Eyes: Conjunctivae are normal.  Neck: Neck supple.  Cardiovascular: Normal rate and regular rhythm.   No murmur heard. Pulmonary/Chest: Effort normal and breath sounds normal. No respiratory distress.  Abdominal: Soft. There is no tenderness.  Musculoskeletal: He exhibits tenderness. He exhibits no edema or deformity.  Full active and passive of ROM of the bilateral knees. Increased pain with extension of the right knee. Full ROM of the bilateral hips and ankles; no TTP. Mild TTP of the medial joint line and quadriceps tendon. No lateral joint line or patella tenderness. Mild swelling to the superior knee. Negative anterior and posterior drawer test. No valgus or varus instability. DP and PT pulses are 2+ bilaterally. NVI. Patellar DTRs are 2+ bilaterally. NVI. Antalgic gait. No overlying warmth, redness, or swelling to the right knee.   Neurological: He is alert.  Skin: Skin is warm and dry.  Psychiatric: He has a normal mood and affect.  Nursing note and vitals reviewed.    ED Treatments / Results  Labs (all labs ordered are listed, but only abnormal results are displayed) Labs Reviewed - No data to display  EKG  EKG Interpretation None       Radiology No results found.  Procedures Procedures (including critical care time)  Medications Ordered in ED Medications  meloxicam (MOBIC) tablet 7.5 mg (7.5 mg Oral Given 12/16/16 0126)     Initial Impression / Assessment and Plan / ED Course  I have reviewed the triage vital signs and the nursing notes.  Pertinent labs & imaging results that were available during my care of the patient were reviewed by me and considered in my medical decision making (see chart for details).     31 y.o. male with acute exacerbation of chronic knee pain. X-ray from 5/27 demonstrates mild  quadriceps insertional enthesopathy. No evidence of fracture, dislocations, or bony lesions. PE demonstrates mild TTP over the medial joint line and near the quadriceps tendon insertion. At this time, the knee appears stable. Bilateral hip and ankle exams are normal. Low suspicion for gout or septic joint. Knee sleeve applied in the ED. Will d/c to home with follow up to ortho, referral to [place], and symptomatic treatment. VSS. NAD. Strict return precautions given. The patient acknowledges the plan and is agreeable at this time.   Final Clinical Impressions(s) / ED Diagnoses   Final diagnoses:  Chronic pain of right knee    New Prescriptions Discharge Medication List as of 12/16/2016 12:52 AM    START taking these medications   Details  cyclobenzaprine (FLEXERIL) 10 MG tablet Take 1 tablet (10 mg total) by mouth 2 (two) times daily as needed for muscle spasms., Starting Fri 12/16/2016, Print  meloxicam (MOBIC) 7.5 MG tablet Take 1 tablet (7.5 mg total) by mouth daily., Starting Fri 12/16/2016, Print         Tristian Bouska A, PA-C 12/20/16 1247    Ward, Delice Bison, DO 12/21/16 [id number]

## 2017-05-03 ENCOUNTER — Emergency Department (HOSPITAL_COMMUNITY)
Admission: EM | Admit: 2017-05-03 | Discharge: 2017-05-03 | Disposition: A | Discharge status: Home / Self Care | Payer: Self-pay | Attending: Emergency Medicine | Admitting: Emergency Medicine

## 2017-05-03 ENCOUNTER — Encounter (HOSPITAL_COMMUNITY): Payer: Self-pay | Admitting: *Deleted

## 2017-05-03 DIAGNOSIS — Z5321 Procedure and treatment not carried out due to patient leaving prior to being seen by health care provider: Secondary | ICD-10-CM | POA: Insufficient documentation

## 2017-05-03 NOTE — ED Triage Notes (Signed)
Pt reports recent high stress levels. Reports being off one of his meds for several months. Having high anxiety levels, feels unsettled, anxious and fatigued and wants to be evaluated. No acute distress is noted at triage. Pt denies SI or HI.

## 2017-05-03 NOTE — ED Notes (Signed)
Pt walked out because he doesn't want to wait any longer.

## 2017-05-12 ENCOUNTER — Emergency Department (HOSPITAL_COMMUNITY)
Admission: EM | Admit: 2017-05-12 | Discharge: 2017-05-12 | Disposition: A | Discharge status: Home / Self Care | Payer: Self-pay | Attending: Emergency Medicine | Admitting: Emergency Medicine

## 2017-05-12 ENCOUNTER — Encounter (HOSPITAL_COMMUNITY): Payer: Self-pay | Admitting: *Deleted

## 2017-05-12 DIAGNOSIS — Z791 Long term (current) use of non-steroidal anti-inflammatories (NSAID): Secondary | ICD-10-CM | POA: Insufficient documentation

## 2017-05-12 DIAGNOSIS — F17210 Nicotine dependence, cigarettes, uncomplicated: Secondary | ICD-10-CM | POA: Insufficient documentation

## 2017-05-12 DIAGNOSIS — M542 Cervicalgia: Secondary | ICD-10-CM | POA: Insufficient documentation

## 2017-05-12 DIAGNOSIS — Z79899 Other long term (current) drug therapy: Secondary | ICD-10-CM | POA: Insufficient documentation

## 2017-05-12 MED ORDER — DICLOFENAC SODIUM 1 % TD GEL: 4.0000 g | Freq: Four times a day (QID) | TRANSDERMAL | 1 refills | Status: DC

## 2017-05-12 MED ORDER — LIDOCAINE 5 % EX PTCH: 1.0000 | MEDICATED_PATCH | CUTANEOUS | 0 refills | Status: DC

## 2017-05-12 MED ORDER — PREDNISONE 10 MG (21) PO TBPK: ORAL_TABLET | ORAL | 0 refills | Status: DC

## 2017-05-12 MED ORDER — KETOROLAC TROMETHAMINE 60 MG/2ML IM SOLN
60.0000 mg | Freq: Once | INTRAMUSCULAR | Status: AC
  Administered 2017-05-12: 60 mg via INTRAMUSCULAR
  Filled 2017-05-12: qty 2

## 2017-05-12 MED ORDER — NAPROXEN 500 MG PO TABS: 500.0000 mg | ORAL_TABLET | Freq: Two times a day (BID) | ORAL | 0 refills | Status: DC

## 2017-05-12 MED ORDER — METHOCARBAMOL 500 MG PO TABS: 500.0000 mg | ORAL_TABLET | Freq: Two times a day (BID) | ORAL | 0 refills | Status: DC

## 2017-05-12 NOTE — ED Triage Notes (Signed)
Pt complains of neck pain that is worse with movement. Pt states he injured his neck 7 years ago and has had issues since.

## 2017-05-12 NOTE — Discharge Instructions (Signed)
Take it easy, but do not lay around too much as this may make any stiffness worse.  Antiinflammatory medications: Take 600 mg of ibuprofen every 6 hours or 440 mg (over the counter dose) to 500 mg (prescription dose) of naproxen every 12 hours for the next 3 days. After this time, these medications may be used as needed for pain. Take these medications with food to avoid upset stomach. Choose only one of these medications, do not take them together.  Tylenol: Should you continue to have additional pain while taking the ibuprofen or naproxen, you may add in tylenol as needed. Your daily total maximum amount of tylenol from all sources should be limited to 4000mg /day for persons without liver problems, or 2000mg /day for those with liver problems. Diclofenac: May apply the diclofenac gel directly to the painful area instead of the above therapies. Prednisone: Take the prednisone, as directed, until gone. Muscle relaxer: Robaxin is a muscle relaxer and may help loosen stiff muscles. Do not take the Robaxin while driving or performing other dangerous activities.  Lidocaine patches: These are available via either prescription or over-the-counter. The over-the-counter option may be more economical one and are likely just as effective. There are multiple over-the-counter brands, such as Salonpas. Exercises: Be sure to perform the attached exercises starting with three times a week and working up to performing them daily. This is an essential part of preventing long term problems.   Follow up with a primary care provider or orthopedic specialist for any future management of these complaints.

## 2017-05-12 NOTE — ED Provider Notes (Signed)
[facility] DEPT Provider Note   CSN: [id number] Arrival date & time: 05/12/17  1752     History   Chief Complaint Chief Complaint  Patient presents with  . Neck Pain    HPI Clinton Dean is a 31 y.o. male.  HPI   Clinton Dean is a 31 y.o. male, with a history of bipolar and schizophrenia, presenting to the ED with neck pain worse than normal over the last month. Bilateral, described as a tightness, moderate intensity, radiates into the bilateral shoulders, worse with flexion and extension of the neck. Injured his neck 7 years ago and has had similar problems since that time intermittently.   Has tried stretching and taking ibuprofen, which helps, but does not resolve the issue. Does construction for a living.  No recent medication changes.  Denies numbness, tingling, weakness, fever/chills, nausea/vomiting, dizziness, or any other complaints.    Past Medical History:  Diagnosis Date  . Bipolar 1 disorder ([place])   . Schizophrenia [facility])     Patient Active Problem List   Diagnosis Date Noted  . Suicide attempt ([place]) 01/19/2015  . Cannabis dependence ([place])   . MDD (major depressive disorder), recurrent severe, without psychosis ([place]) 12/09/2014  . Suicide attempt by drug ingestion ([place]) 12/09/2014  . Bipolar 1 disorder, mixed, severe ([place]) 12/08/2014  . Cannabis dependence, continuous ([place]) 12/08/2014    Past Surgical History:  Procedure Laterality Date  . APPENDECTOMY         Home Medications    Prior to Admission medications   Medication Sig Start Date End Date Taking? Authorizing Provider  cetirizine (ZYRTEC) 10 MG tablet Take 10 mg by mouth daily as needed for allergies.    [provider]  cyclobenzaprine (FLEXERIL) 10 MG tablet Take 1 tablet (10 mg total) by mouth 2 (two) times daily as needed for muscle spasms. 12/16/16   McDonald, Mia A, PA-C  diclofenac sodium (VOLTAREN) 1 % GEL Apply 4 g topically 4 (four) times  daily. 05/12/17   ,  C, PA-C  divalproex (DEPAKOTE) 500 MG DR tablet Take 1 tablet (500 mg total) by mouth every 12 (twelve) hours. 01/21/15   Pucilowska, Jolanta B, MD  ibuprofen (ADVIL,MOTRIN) 200 MG tablet Take 800 mg by mouth every 6 (six) hours as needed for moderate pain.    [provider]  lidocaine (LIDODERM) 5 % Place 1 patch onto the skin daily. Remove & Discard patch within 12 hours or as directed by MD 05/12/17   ,  C, PA-C  meloxicam (MOBIC) 7.5 MG tablet Take 1 tablet (7.5 mg total) by mouth daily. 12/16/16   McDonald, Mia A, PA-C  methocarbamol (ROBAXIN) 500 MG tablet Take 1 tablet (500 mg total) by mouth 2 (two) times daily. 05/12/17   ,  C, PA-C  naproxen (NAPROSYN) 500 MG tablet Take 1 tablet (500 mg total) by mouth 2 (two) times daily. 05/12/17   ,  C, PA-C  OLANZapine (ZYPREXA) 5 MG tablet Take 1 tablet (5 mg total) by mouth at bedtime. 01/21/15   Pucilowska, Jolanta B, MD  pantoprazole (PROTONIX) 20 MG tablet Take 1 tablet (20 mg total) by mouth daily. For reflux. Patient not taking: Reported on 01/19/2015 12/11/14   Niel Hummer, NP  predniSONE (STERAPRED UNI-PAK 21 TAB) 10 MG (21) TBPK tablet Take 6 tabs (60mg ) on day 1, 5 tabs (50mg ) on day 2, 4 tabs (40mg ) on day 3, 3 tabs (30mg ) on day 4, 2 tabs (20mg ) on day  5, and 1 tab (10mg ) on day 6. 05/12/17   ,  C, PA-C  traZODone (DESYREL) 50 MG tablet Take 1 tablet (50 mg total) by mouth at bedtime and may repeat dose one time if needed. 01/21/15   Clovis Fredrickson, MD    Family History No family history on file.  Social History Social History  Substance Use Topics  . Smoking status: Current Every Day Smoker    Packs/day: 0.25    Types: Cigarettes  . Smokeless tobacco: Never Used  . Alcohol use Yes     Comment: 2-3 beers a week     Allergies   Vicodin [hydrocodone-acetaminophen]   Review of Systems Review of Systems  Constitutional: Negative for chills and fever.    Gastrointestinal: Negative for nausea and vomiting.  Musculoskeletal: Positive for myalgias and neck pain.  Neurological: Negative for dizziness, syncope, weakness, light-headedness, numbness and headaches.     Physical Exam Updated Vital Signs BP 134/81 (BP Location: Right Arm)   Pulse 77   Temp 98.5 F (36.9 C) (Oral)   Resp 18   SpO2 100%   Physical Exam  Constitutional: He is oriented to person, place, and time. He appears well-developed and well-nourished. No distress.  HENT:  Head: Normocephalic and atraumatic.  Eyes: Pupils are equal, round, and reactive to light. Conjunctivae and EOM are normal.  Neck: Neck supple.  Cardiovascular: Normal rate, regular rhythm, normal heart sounds and intact distal pulses.   Pulmonary/Chest: Effort normal and breath sounds normal. No respiratory distress.  Abdominal: Soft. There is no tenderness. There is no guarding.  Musculoskeletal: He exhibits tenderness. He exhibits no edema.  Tenderness to the bilateral cervical musculature into the bilateral superior trapezius. Full ROM in the neck Full ROM in shoulders.  Normal motor function intact in all extremities and spine. No midline spinal tenderness.   Lymphadenopathy:    He has no cervical adenopathy.  Neurological: He is alert and oriented to person, place, and time.  No sensory deficits.  No noted speech deficits. No aphasia. Patient handles oral secretions without difficulty. No noted swallowing defects.  Equal grip strength bilaterally. Strength 5/5 in the upper extremities. Strength 5/5 with flexion and extension of the hips, knees, and ankles bilaterally.  Patellar DTRs 2+ bilaterally Negative Romberg. No gait disturbance.  Coordination intact including heel to shin and finger to nose.  Cranial nerves III-XII grossly intact.  No facial droop.   Skin: Skin is warm and dry. Capillary refill takes less than 2 seconds. He is not diaphoretic.  Psychiatric: He has a normal mood  and affect. His behavior is normal.  Nursing note and vitals reviewed.    ED Treatments / Results  Labs (all labs ordered are listed, but only abnormal results are displayed) Labs Reviewed - No data to display  EKG  EKG Interpretation None       Radiology No results found.  Procedures Procedures (including critical care time)  Medications Ordered in ED Medications  ketorolac (TORADOL) injection 60 mg (60 mg Intramuscular Given 05/12/17 2205)     Initial Impression / Assessment and Plan / ED Course  I have reviewed the triage vital signs and the nursing notes.  Pertinent labs & imaging results that were available during my care of the patient were reviewed by me and considered in my medical decision making (see chart for details).      Patient presents with recurrence of his neck and shoulder pain.  Suspect muscular origin.  No noted  red flag symptoms.  My suspicion for patient's complaint being related to his medications is low. The patient was given instructions for home care as well as return precautions. Patient voices understanding of these instructions, accepts the plan, and is comfortable with discharge.     Final Clinical Impressions(s) / ED Diagnoses   Final diagnoses:  Neck pain    New Prescriptions Discharge Medication List as of 05/12/2017  9:48 PM    START taking these medications   Details  diclofenac sodium (VOLTAREN) 1 % GEL Apply 4 g topically 4 (four) times daily., Starting Fri 05/12/2017, Print    lidocaine (LIDODERM) 5 % Place 1 patch onto the skin daily. Remove & Discard patch within 12 hours or as directed by MD, Starting Fri 05/12/2017, Print    methocarbamol (ROBAXIN) 500 MG tablet Take 1 tablet (500 mg total) by mouth 2 (two) times daily., Starting Fri 05/12/2017, Print    naproxen (NAPROSYN) 500 MG tablet Take 1 tablet (500 mg total) by mouth 2 (two) times daily., Starting Fri 05/12/2017, Print    predniSONE (STERAPRED UNI-PAK 21  TAB) 10 MG (21) TBPK tablet Take 6 tabs (60mg ) on day 1, 5 tabs (50mg ) on day 2, 4 tabs (40mg ) on day 3, 3 tabs (30mg ) on day 4, 2 tabs (20mg ) on day 5, and 1 tab (10mg ) on day 6., Print         Lorayne Bender, PA-C 05/16/17 1612    Milton Ferguson, MD 05/16/17 2203

## 2017-08-30 ENCOUNTER — Emergency Department (HOSPITAL_COMMUNITY)
Admission: EM | Admit: 2017-08-30 | Discharge: 2017-08-30 | Disposition: A | Discharge status: Home / Self Care | Payer: Self-pay

## 2017-08-30 NOTE — ED Triage Notes (Signed)
Pt left stating he was going to be seen at [facility]; will dismiss out of system; pt ambulatory out lobby door

## 2017-08-31 ENCOUNTER — Other Ambulatory Visit: Payer: Self-pay

## 2017-08-31 ENCOUNTER — Encounter: Payer: Self-pay | Admitting: Emergency Medicine

## 2017-08-31 ENCOUNTER — Emergency Department: Payer: Self-pay

## 2017-08-31 ENCOUNTER — Emergency Department
Admission: EM | Admit: 2017-08-31 | Discharge: 2017-08-31 | Disposition: A | Discharge status: Home / Self Care | Payer: Self-pay | Attending: Emergency Medicine | Admitting: Emergency Medicine

## 2017-08-31 DIAGNOSIS — M791 Myalgia, unspecified site: Secondary | ICD-10-CM | POA: Insufficient documentation

## 2017-08-31 DIAGNOSIS — Z79899 Other long term (current) drug therapy: Secondary | ICD-10-CM | POA: Insufficient documentation

## 2017-08-31 DIAGNOSIS — J40 Bronchitis, not specified as acute or chronic: Secondary | ICD-10-CM | POA: Insufficient documentation

## 2017-08-31 DIAGNOSIS — R059 Cough, unspecified: Secondary | ICD-10-CM

## 2017-08-31 DIAGNOSIS — F17210 Nicotine dependence, cigarettes, uncomplicated: Secondary | ICD-10-CM | POA: Insufficient documentation

## 2017-08-31 DIAGNOSIS — R05 Cough: Secondary | ICD-10-CM | POA: Insufficient documentation

## 2017-08-31 MED ORDER — BENZONATATE 200 MG PO CAPS: 200.0000 mg | ORAL_CAPSULE | Freq: Three times a day (TID) | ORAL | 0 refills | Status: DC | PRN

## 2017-08-31 MED ORDER — ALBUTEROL SULFATE HFA 108 (90 BASE) MCG/ACT IN AERS
2.0000 | INHALATION_SPRAY | RESPIRATORY_TRACT | 0 refills | Status: DC | PRN
Start: 2017-08-31 — End: 2019-02-08

## 2017-08-31 MED ORDER — BENZONATATE 100 MG PO CAPS
200.0000 mg | ORAL_CAPSULE | Freq: Once | ORAL | Status: AC
  Administered 2017-08-31: 200 mg via ORAL
  Filled 2017-08-31: qty 2

## 2017-08-31 NOTE — ED Triage Notes (Signed)
Patient ambulatory to triage with steady gait, without difficulty or distress noted; pt reports cold symptoms x 2wks; last several days having dry cough

## 2017-08-31 NOTE — ED Provider Notes (Signed)
[facility] Emergency Department Provider Note   ____________________________________________   First MD Initiated Contact with Patient 08/31/17 [phone]     (approximate)  I have reviewed the triage vital signs and the nursing notes.   HISTORY  Chief Complaint Cough    HPI Clinton Dean is a 32 y.o. male who presents to the ED from home with a chief complaint of cough.  Patient reports cold-like symptoms for the past 2 weeks; body aches have improved but dry cough remains.  Denies associated fever, chills, chest pain, shortness of breath, abdominal pain, nausea or vomiting.  Denies recent travel or trauma.   Past Medical History:  Diagnosis Date  . Bipolar 1 disorder ([place])   . Schizophrenia [facility])     Patient Active Problem List   Diagnosis Date Noted  . Suicide attempt ([place]) 01/19/2015  . Cannabis dependence ([place])   . MDD (major depressive disorder), recurrent severe, without psychosis ([place]) 12/09/2014  . Suicide attempt by drug ingestion ([place]) 12/09/2014  . Bipolar 1 disorder, mixed, severe ([place]) 12/08/2014  . Cannabis dependence, continuous ([place]) 12/08/2014    Past Surgical History:  Procedure Laterality Date  . APPENDECTOMY      Prior to Admission medications   Medication Sig Start Date End Date Taking? Authorizing Provider  albuterol (PROVENTIL HFA;VENTOLIN HFA) 108 (90 Base) MCG/ACT inhaler Inhale 2 puffs into the lungs every 4 (four) hours as needed for wheezing or shortness of breath. 08/31/17   Paulette Blanch, MD  benzonatate (TESSALON) 200 MG capsule Take 1 capsule (200 mg total) by mouth 3 (three) times daily as needed for cough. 08/31/17   Paulette Blanch, MD  cetirizine (ZYRTEC) 10 MG tablet Take 10 mg by mouth daily as needed for allergies.    [provider]  cyclobenzaprine (FLEXERIL) 10 MG tablet Take 1 tablet (10 mg total) by mouth 2 (two) times daily as needed for muscle spasms. 12/16/16   McDonald, Mia A, PA-C  diclofenac  sodium (VOLTAREN) 1 % GEL Apply 4 g topically 4 (four) times daily. 05/12/17   Joy, Shawn C, PA-C  divalproex (DEPAKOTE) 500 MG DR tablet Take 1 tablet (500 mg total) by mouth every 12 (twelve) hours. 01/21/15   Pucilowska, Jolanta B, MD  ibuprofen (ADVIL,MOTRIN) 200 MG tablet Take 800 mg by mouth every 6 (six) hours as needed for moderate pain.    [provider]  lidocaine (LIDODERM) 5 % Place 1 patch onto the skin daily. Remove & Discard patch within 12 hours or as directed by MD 05/12/17   Joy, Shawn C, PA-C  meloxicam (MOBIC) 7.5 MG tablet Take 1 tablet (7.5 mg total) by mouth daily. 12/16/16   McDonald, Mia A, PA-C  methocarbamol (ROBAXIN) 500 MG tablet Take 1 tablet (500 mg total) by mouth 2 (two) times daily. 05/12/17   Joy, Shawn C, PA-C  naproxen (NAPROSYN) 500 MG tablet Take 1 tablet (500 mg total) by mouth 2 (two) times daily. 05/12/17   Joy, Shawn C, PA-C  OLANZapine (ZYPREXA) 5 MG tablet Take 1 tablet (5 mg total) by mouth at bedtime. 01/21/15   Pucilowska, Jolanta B, MD  pantoprazole (PROTONIX) 20 MG tablet Take 1 tablet (20 mg total) by mouth daily. For reflux. Patient not taking: Reported on 01/19/2015 12/11/14   Niel Hummer, NP  predniSONE (STERAPRED UNI-PAK 21 TAB) 10 MG (21) TBPK tablet Take 6 tabs (60mg ) on day 1, 5 tabs (50mg ) on day 2, 4 tabs (40mg ) on day 3,  3 tabs (30mg ) on day 4, 2 tabs (20mg ) on day 5, and 1 tab (10mg ) on day 6. 05/12/17   Joy, Shawn C, PA-C  traZODone (DESYREL) 50 MG tablet Take 1 tablet (50 mg total) by mouth at bedtime and may repeat dose one time if needed. 01/21/15   Pucilowska, Wardell Honour, MD    Allergies Vicodin [hydrocodone-acetaminophen]  No family history on file.  Social History Social History   Tobacco Use  . Smoking status: Current Every Day Smoker    Packs/day: 0.25    Types: Cigarettes  . Smokeless tobacco: Never Used  Substance Use Topics  . Alcohol use: Yes    Comment: 2-3 beers a week  . Drug use: No    Comment: drug screen  positive for THC    Review of Systems  Constitutional: No fever/chills. Eyes: No visual changes. ENT: No sore throat. Cardiovascular: Denies chest pain. Respiratory: Positive for nonproductive cough.  Denies shortness of breath. Gastrointestinal: No abdominal pain.  No nausea, no vomiting.  No diarrhea.  No constipation. Genitourinary: Negative for dysuria. Musculoskeletal: Negative for back pain. Skin: Negative for rash. Neurological: Negative for headaches, focal weakness or numbness.   ____________________________________________   PHYSICAL EXAM:  VITAL SIGNS: ED Triage Vitals [08/31/17 0012]  Enc Vitals Group     BP 136/73     Pulse Rate 85     Resp 18     Temp 98.1 F (36.7 C)     Temp Source Oral     SpO2 99 %     Weight 186 lb (84.4 kg)     Height 5\' 7"  (1.702 m)     Head Circumference      Peak Flow      Pain Score      Pain Loc      Pain Edu?      Excl. in [place]?     Constitutional: Alert and oriented. Well appearing and in no acute distress. Eyes: Conjunctivae are normal. PERRL. EOMI. Head: Atraumatic. Nose: No congestion/rhinnorhea. Mouth/Throat: Mucous membranes are moist.  Oropharynx non-erythematous. Neck: No stridor.   Cardiovascular: Normal rate, regular rhythm. Grossly normal heart sounds.  Good peripheral circulation. Respiratory: Normal respiratory effort.  No retractions. Lungs CTAB. Gastrointestinal: Soft and nontender. No distention. No abdominal bruits. No CVA tenderness. Musculoskeletal: No lower extremity tenderness nor edema.  No joint effusions. Neurologic:  Normal speech and language. No gross focal neurologic deficits are appreciated. No gait instability. Skin:  Skin is warm, dry and intact. No rash noted. Psychiatric: Mood and affect are normal. Speech and behavior are normal.  ____________________________________________   LABS (all labs ordered are listed, but only abnormal results are displayed)  Labs Reviewed - No data to  display ____________________________________________  EKG  None ____________________________________________  RADIOLOGY  ED MD interpretation: No pneumonia  Official radiology report(s): Dg Chest 2 View  Result Date: 08/31/2017 CLINICAL DATA:  Cough.  Cold symptoms for 2 weeks. EXAM: CHEST  2 VIEW COMPARISON:  None. FINDINGS: The cardiomediastinal contours are normal. The lungs are clear. Pulmonary vasculature is normal. No consolidation, pleural effusion, or pneumothorax. No acute osseous abnormalities are seen. IMPRESSION: Unremarkable radiographs of the chest. Electronically Signed   By: Jeb Levering M.D.   On: 08/31/2017 00:36    ____________________________________________   PROCEDURES  Procedure(s) performed: None  Procedures  Critical Care performed: No  ____________________________________________   INITIAL IMPRESSION / ASSESSMENT AND PLAN / ED COURSE  As part of my medical decision making, I  reviewed the following data within the [facility] notes reviewed and incorporated, Old chart reviewed, Radiograph reviewed and Notes from prior ED visits.   32 year old male who presents with bronchitic cough.  No pneumonia seen on chest x-ray.  Will treat with Tessalon, albuterol inhaler to use as needed, and he will follow-up closely with his PCP.  Strict return precautions given.  Patient verbalizes understanding and agrees with plan of care.      ____________________________________________   FINAL CLINICAL IMPRESSION(S) / ED DIAGNOSES  Final diagnoses:  Cough  Bronchitis     ED Discharge Orders        Ordered    albuterol (PROVENTIL HFA;VENTOLIN HFA) 108 (90 Base) MCG/ACT inhaler  Every 4 hours PRN     08/31/17 0124    benzonatate (TESSALON) 200 MG capsule  3 times daily PRN     08/31/17 0124       Note:  This document was prepared using Dragon voice recognition software and may include unintentional dictation errors.      Paulette Blanch, MD 08/31/17 [phone]

## 2017-08-31 NOTE — Discharge Instructions (Signed)
1.  You may take cough medicine as needed (Tessalon). 2.  Use albuterol inhaler 2 puffs every 4 hours as needed for cough/wheezing/difficulty breathing. 3.  Return to the ER for worsening symptoms, persistent vomiting, difficulty breathing or other concerns.

## 2017-10-28 ENCOUNTER — Emergency Department (HOSPITAL_COMMUNITY)
Admission: EM | Admit: 2017-10-28 | Discharge: 2017-10-28 | Disposition: A | Discharge status: Home / Self Care | Payer: Self-pay | Attending: Emergency Medicine | Admitting: Emergency Medicine

## 2017-10-28 ENCOUNTER — Encounter (HOSPITAL_COMMUNITY): Payer: Self-pay | Admitting: Emergency Medicine

## 2017-10-28 ENCOUNTER — Other Ambulatory Visit: Payer: Self-pay

## 2017-10-28 DIAGNOSIS — Y92009 Unspecified place in unspecified non-institutional (private) residence as the place of occurrence of the external cause: Secondary | ICD-10-CM | POA: Insufficient documentation

## 2017-10-28 DIAGNOSIS — F17210 Nicotine dependence, cigarettes, uncomplicated: Secondary | ICD-10-CM | POA: Insufficient documentation

## 2017-10-28 DIAGNOSIS — Y939 Activity, unspecified: Secondary | ICD-10-CM | POA: Insufficient documentation

## 2017-10-28 DIAGNOSIS — Z23 Encounter for immunization: Secondary | ICD-10-CM | POA: Insufficient documentation

## 2017-10-28 DIAGNOSIS — Z79899 Other long term (current) drug therapy: Secondary | ICD-10-CM | POA: Insufficient documentation

## 2017-10-28 DIAGNOSIS — S61411A Laceration without foreign body of right hand, initial encounter: Secondary | ICD-10-CM | POA: Insufficient documentation

## 2017-10-28 DIAGNOSIS — W260XXA Contact with knife, initial encounter: Secondary | ICD-10-CM | POA: Insufficient documentation

## 2017-10-28 DIAGNOSIS — Y999 Unspecified external cause status: Secondary | ICD-10-CM | POA: Insufficient documentation

## 2017-10-28 MED ORDER — TETANUS-DIPHTH-ACELL PERTUSSIS 5-2.5-18.5 LF-MCG/0.5 IM SUSP
0.5000 mL | Freq: Once | INTRAMUSCULAR | Status: AC
  Administered 2017-10-28: 0.5 mL via INTRAMUSCULAR
  Filled 2017-10-28: qty 0.5

## 2017-10-28 MED ORDER — LIDOCAINE HCL (PF) 1 % IJ SOLN
5.0000 mL | Freq: Once | INTRAMUSCULAR | Status: DC
  Filled 2017-10-28: qty 5

## 2017-10-28 NOTE — ED Provider Notes (Signed)
[facility] EMERGENCY DEPARTMENT Provider Note   CSN: [id number] Arrival date & time: 10/28/17  2103     History   Chief Complaint Chief Complaint  Patient presents with  . Extremity Laceration    HPI Clinton Dean is a 32 y.o. male with a hx of bipolar disorder, schizophrenia presents to the Emergency Department complaining of acute, persistent, laceration to the left dorsum of his hand onset around 6 PM.  Patient reports he was using a rest a box cutter while opening a TV stand when he accidentally cut his hand.  He reports hemostasis prior to arrival.  Patient denies stab wound to the hand.  Unknown last tetanus.  No aggravating or alleviating factors.  Patient denies numbness, weakness, persistent bleeding.   The history is provided by the patient and medical records. No language interpreter was used.    Past Medical History:  Diagnosis Date  . Bipolar 1 disorder ([place])   . Schizophrenia [facility])     Patient Active Problem List   Diagnosis Date Noted  . Suicide attempt ([place]) 01/19/2015  . Cannabis dependence ([place])   . MDD (major depressive disorder), recurrent severe, without psychosis ([place]) 12/09/2014  . Suicide attempt by drug ingestion ([place]) 12/09/2014  . Bipolar 1 disorder, mixed, severe ([place]) 12/08/2014  . Cannabis dependence, continuous ([place]) 12/08/2014    Past Surgical History:  Procedure Laterality Date  . APPENDECTOMY          Home Medications    Prior to Admission medications   Medication Sig Start Date End Date Taking? Authorizing Provider  albuterol (PROVENTIL HFA;VENTOLIN HFA) 108 (90 Base) MCG/ACT inhaler Inhale 2 puffs into the lungs every 4 (four) hours as needed for wheezing or shortness of breath. 08/31/17   Paulette Blanch, MD  benzonatate (TESSALON) 200 MG capsule Take 1 capsule (200 mg total) by mouth 3 (three) times daily as needed for cough. 08/31/17   Paulette Blanch, MD  cetirizine (ZYRTEC) 10 MG tablet Take 10 mg by mouth daily  as needed for allergies.    [provider]  cyclobenzaprine (FLEXERIL) 10 MG tablet Take 1 tablet (10 mg total) by mouth 2 (two) times daily as needed for muscle spasms. 12/16/16   McDonald, Mia A, PA-C  diclofenac sodium (VOLTAREN) 1 % GEL Apply 4 g topically 4 (four) times daily. 05/12/17   Joy, Shawn C, PA-C  divalproex (DEPAKOTE) 500 MG DR tablet Take 1 tablet (500 mg total) by mouth every 12 (twelve) hours. 01/21/15   Pucilowska, Jolanta B, MD  ibuprofen (ADVIL,MOTRIN) 200 MG tablet Take 800 mg by mouth every 6 (six) hours as needed for moderate pain.    [provider]  lidocaine (LIDODERM) 5 % Place 1 patch onto the skin daily. Remove & Discard patch within 12 hours or as directed by MD 05/12/17   Joy, Shawn C, PA-C  meloxicam (MOBIC) 7.5 MG tablet Take 1 tablet (7.5 mg total) by mouth daily. 12/16/16   McDonald, Mia A, PA-C  methocarbamol (ROBAXIN) 500 MG tablet Take 1 tablet (500 mg total) by mouth 2 (two) times daily. 05/12/17   Joy, Shawn C, PA-C  naproxen (NAPROSYN) 500 MG tablet Take 1 tablet (500 mg total) by mouth 2 (two) times daily. 05/12/17   Joy, Shawn C, PA-C  OLANZapine (ZYPREXA) 5 MG tablet Take 1 tablet (5 mg total) by mouth at bedtime. 01/21/15   Pucilowska, Jolanta B, MD  pantoprazole (PROTONIX) 20 MG tablet Take 1 tablet (20 mg  total) by mouth daily. For reflux. Patient not taking: Reported on 01/19/2015 12/11/14   Niel Hummer, NP  predniSONE (STERAPRED UNI-PAK 21 TAB) 10 MG (21) TBPK tablet Take 6 tabs (60mg ) on day 1, 5 tabs (50mg ) on day 2, 4 tabs (40mg ) on day 3, 3 tabs (30mg ) on day 4, 2 tabs (20mg ) on day 5, and 1 tab (10mg ) on day 6. 05/12/17   Joy, Shawn C, PA-C  traZODone (DESYREL) 50 MG tablet Take 1 tablet (50 mg total) by mouth at bedtime and may repeat dose one time if needed. 01/21/15   Clovis Fredrickson, MD    Family History No family history on file.  Social History Social History   Tobacco Use  . Smoking status: Current Every Day Smoker      Packs/day: 0.25    Types: Cigarettes  . Smokeless tobacco: Never Used  Substance Use Topics  . Alcohol use: Yes    Comment: 2-3 beers a week  . Drug use: No    Comment: drug screen positive for THC     Allergies   Vicodin [hydrocodone-acetaminophen]   Review of Systems Review of Systems  Constitutional: Negative for fever.  Gastrointestinal: Negative for nausea and vomiting.  Skin: Positive for wound.  Allergic/Immunologic: Negative for immunocompromised state.  Neurological: Negative for weakness and numbness.  Hematological: Does not bruise/bleed easily.  Psychiatric/Behavioral: The patient is not nervous/anxious.      Physical Exam Updated Vital Signs BP 125/75 (BP Location: Right Arm)   Pulse 77   Temp 98.5 F (36.9 C) (Oral)   Resp 16   SpO2 98%   Physical Exam  Constitutional: He is oriented to person, place, and time. He appears well-developed and well-nourished. No distress.  HENT:  Head: Normocephalic and atraumatic.  Eyes: Conjunctivae are normal. No scleral icterus.  Neck: Normal range of motion.  Cardiovascular: Normal rate, regular rhythm, normal heart sounds and intact distal pulses.  No murmur heard. Capillary refill < 3 sec  Pulmonary/Chest: Effort normal and breath sounds normal. No respiratory distress.  Musculoskeletal: Normal range of motion. He exhibits no edema.       Hands: Full range of motion of the left thumb.  Sensation intact to normal touch throughout the entire left thumb.  Strength 5/5 with flexion and extension of the left thumb.  Strong and equal grip strength.  2.5 cm laceration to the dorsum of the left hand over the left thumb.  Neurological: He is alert and oriented to person, place, and time.  Skin: Skin is warm and dry. He is not diaphoretic.  Psychiatric: He has a normal mood and affect.  Nursing note and vitals reviewed.    ED Treatments / Results   Procedures .Marland KitchenLaceration Repair Date/Time: 10/28/2017 11:29  PM Performed by: Abigail Butts, PA-C Authorized by: Abigail Butts, PA-C   Consent:    Consent obtained:  Verbal   Consent given by:  Patient   Risks discussed:  Infection, pain, poor cosmetic result and poor wound healing   Alternatives discussed:  No treatment Anesthesia (see MAR for exact dosages):    Anesthesia method:  Local infiltration   Local anesthetic:  Lidocaine 1% w/o epi (68mL) Laceration details:    Location:  Hand   Hand location:  L hand, dorsum   Length (cm):  2.5 Repair type:    Repair type:  Simple Pre-procedure details:    Preparation:  Patient was prepped and draped in usual sterile fashion Exploration:    Hemostasis  achieved with:  Direct pressure   Wound exploration: wound explored through full range of motion and entire depth of wound probed and visualized   Treatment:    Area cleansed with:  Saline   Amount of cleaning:  Standard   Irrigation solution:  Sterile water   Irrigation method:  Syringe Skin repair:    Repair method:  Sutures   Suture size:  5-0   Suture material:  Prolene   Suture technique:  Simple interrupted   Number of sutures:  3 Approximation:    Approximation:  Close Post-procedure details:    Dressing:  Open (no dressing)   Patient tolerance of procedure:  Tolerated well, no immediate complications   (including critical care time)  Medications Ordered in ED Medications  lidocaine (PF) (XYLOCAINE) 1 % injection 5 mL (has no administration in time range)  Tdap (BOOSTRIX) injection 0.5 mL (0.5 mLs Intramuscular Given 10/28/17 2315)     Initial Impression / Assessment and Plan / ED Course  I have reviewed the triage vital signs and the nursing notes.  Pertinent labs & imaging results that were available during my care of the patient were reviewed by me and considered in my medical decision making (see chart for details).     Pressure irrigation performed. Wound explored and base of wound visualized in a  bloodless field without evidence of foreign body.  Laceration occurred < 8 hours prior to repair which was well tolerated.  Tdap updated.  Pt has no comorbidities to effect normal wound healing. Pt discharged without antibiotics.  Discussed suture home care with patient and answered questions. Pt to follow-up for wound check and suture removal in 7 days; they are to return to the ED sooner for signs of infection. Pt is hemodynamically stable with no complaints prior to dc.    Final Clinical Impressions(s) / ED Diagnoses   Final diagnoses:  Laceration of right hand without foreign body, initial encounter    ED Discharge Orders    None       Charlye Spare, Gwenlyn Perking 10/29/17 Judithann Sauger    Duffy Bruce, MD 10/29/17 [phone]

## 2017-10-28 NOTE — ED Triage Notes (Addendum)
Pt was putting together a dresser and cut his left hand, thenar imminence. States he's had many stitches for lacerations in the past and feels this will need sutures. Bleeding controlled.  States it bled through 1 band-aid so far in the past 4 hours.  Bandaged at this time.

## 2017-10-28 NOTE — Discharge Instructions (Addendum)

## 2018-01-01 DIAGNOSIS — Z720 Tobacco use: Secondary | ICD-10-CM | POA: Diagnosis not present

## 2018-01-01 DIAGNOSIS — M2550 Pain in unspecified joint: Secondary | ICD-10-CM | POA: Diagnosis not present

## 2018-01-01 DIAGNOSIS — C884 Extranodal marginal zone B-cell lymphoma of mucosa-associated lymphoid tissue [MALT-lymphoma]: Secondary | ICD-10-CM | POA: Diagnosis not present

## 2018-01-02 ENCOUNTER — Encounter (HOSPITAL_COMMUNITY): Payer: Self-pay | Admitting: *Deleted

## 2018-01-02 ENCOUNTER — Other Ambulatory Visit: Payer: Self-pay

## 2018-01-02 ENCOUNTER — Emergency Department (HOSPITAL_COMMUNITY)
Admission: EM | Admit: 2018-01-02 | Discharge: 2018-01-03 | Disposition: A | Discharge status: Home / Self Care | Payer: Self-pay | Attending: Emergency Medicine | Admitting: Emergency Medicine

## 2018-01-02 DIAGNOSIS — F17210 Nicotine dependence, cigarettes, uncomplicated: Secondary | ICD-10-CM | POA: Insufficient documentation

## 2018-01-02 DIAGNOSIS — R5383 Other fatigue: Secondary | ICD-10-CM | POA: Insufficient documentation

## 2018-01-02 DIAGNOSIS — R1110 Vomiting, unspecified: Secondary | ICD-10-CM | POA: Insufficient documentation

## 2018-01-02 DIAGNOSIS — Z79899 Other long term (current) drug therapy: Secondary | ICD-10-CM | POA: Insufficient documentation

## 2018-01-02 DIAGNOSIS — R634 Abnormal weight loss: Secondary | ICD-10-CM | POA: Insufficient documentation

## 2018-01-02 NOTE — ED Triage Notes (Signed)
To ED for eval of continued fatigue, daily vomiting, and decreased appetite for the past few months. Weight loss. Appears in NAD. Pt states he was told a year ago while in jail that he had 'malt cancer' in his abd. Treated intermittently with a week of abx. States his PCP did blood work last week and waiting on results.

## 2018-01-03 ENCOUNTER — Emergency Department (HOSPITAL_COMMUNITY): Payer: Self-pay

## 2018-01-03 DIAGNOSIS — R1110 Vomiting, unspecified: Secondary | ICD-10-CM | POA: Diagnosis not present

## 2018-01-03 LAB — CBC
HCT: 42.4 % (ref 39.0–52.0)
Hemoglobin: 13.8 g/dL (ref 13.0–17.0)
MCH: 27.5 pg (ref 26.0–34.0)
MCHC: 32.5 g/dL (ref 30.0–36.0)
MCV: 84.6 fL (ref 78.0–100.0)
PLATELETS: 209 10*3/uL (ref 150–400)
RBC: 5.01 MIL/uL (ref 4.22–5.81)
RDW: 14.8 % (ref 11.5–15.5)
WBC: 9.4 10*3/uL (ref 4.0–10.5)

## 2018-01-03 LAB — COMPREHENSIVE METABOLIC PANEL
ALK PHOS: 63 U/L (ref 38–126)
ALT: 17 U/L (ref 17–63)
AST: 22 U/L (ref 15–41)
Albumin: 4 g/dL (ref 3.5–5.0)
Anion gap: 6 (ref 5–15)
BUN: 10 mg/dL (ref 6–20)
CALCIUM: 8.9 mg/dL (ref 8.9–10.3)
CO2: 24 mmol/L (ref 22–32)
CREATININE: 0.88 mg/dL (ref 0.61–1.24)
Chloride: 108 mmol/L (ref 101–111)
Glucose, Bld: 90 mg/dL (ref 65–99)
Potassium: 3.6 mmol/L (ref 3.5–5.1)
Sodium: 138 mmol/L (ref 135–145)
TOTAL PROTEIN: 6.9 g/dL (ref 6.5–8.1)
Total Bilirubin: 1.1 mg/dL (ref 0.3–1.2)

## 2018-01-03 LAB — URINALYSIS, ROUTINE W REFLEX MICROSCOPIC
Bacteria, UA: NONE SEEN
Bilirubin Urine: NEGATIVE
GLUCOSE, UA: NEGATIVE mg/dL
KETONES UR: NEGATIVE mg/dL
Nitrite: NEGATIVE
PROTEIN: NEGATIVE mg/dL
Specific Gravity, Urine: 1.03 (ref 1.005–1.030)
pH: 5 (ref 5.0–8.0)

## 2018-01-03 LAB — LIPASE, BLOOD: LIPASE: 47 U/L (ref 11–51)

## 2018-01-03 NOTE — ED Provider Notes (Signed)
[facility] EMERGENCY DEPARTMENT Provider Note   CSN: [id number] Arrival date & time: 01/02/18  2329     History   Chief Complaint Chief Complaint  Patient presents with  . Emesis  . Fatigue    HPI Clinton Dean is a 32 y.o. male.  Patient presents for treatment of fatigue and anorexia that has been going on for several months. He reports increased inability to eat or drink anything over the last one month with a 20 pound weight loss. He was seen by his doctor (Dr. Sabra Heck) who obtained labs that he has not received results for. He states he is here for help with fatigue because energy drinks and caffeine "are not cutting it". No pain, fever. No new symptoms of concern to the patient.   The history is provided by the patient. No language interpreter was used.    Past Medical History:  Diagnosis Date  . Bipolar 1 disorder ([place])   . Schizophrenia [facility])     Patient Active Problem List   Diagnosis Date Noted  . Suicide attempt ([place]) 01/19/2015  . Cannabis dependence ([place])   . MDD (major depressive disorder), recurrent severe, without psychosis ([place]) 12/09/2014  . Suicide attempt by drug ingestion ([place]) 12/09/2014  . Bipolar 1 disorder, mixed, severe ([place]) 12/08/2014  . Cannabis dependence, continuous ([place]) 12/08/2014    Past Surgical History:  Procedure Laterality Date  . APPENDECTOMY          Home Medications    Prior to Admission medications   Medication Sig Start Date End Date Taking? Authorizing Provider  albuterol (PROVENTIL HFA;VENTOLIN HFA) 108 (90 Base) MCG/ACT inhaler Inhale 2 puffs into the lungs every 4 (four) hours as needed for wheezing or shortness of breath. Patient not taking: Reported on 01/03/2018 08/31/17   Paulette Blanch, MD  benzonatate (TESSALON) 200 MG capsule Take 1 capsule (200 mg total) by mouth 3 (three) times daily as needed for cough. Patient not taking: Reported on 01/03/2018 08/31/17   Paulette Blanch, MD  cyclobenzaprine  (FLEXERIL) 10 MG tablet Take 1 tablet (10 mg total) by mouth 2 (two) times daily as needed for muscle spasms. Patient not taking: Reported on 01/03/2018 12/16/16   McDonald, Mia A, PA-C  diclofenac sodium (VOLTAREN) 1 % GEL Apply 4 g topically 4 (four) times daily. Patient not taking: Reported on 01/03/2018 05/12/17   Joy, Raquel Sarna C, PA-C  divalproex (DEPAKOTE) 500 MG DR tablet Take 1 tablet (500 mg total) by mouth every 12 (twelve) hours. Patient not taking: Reported on 01/03/2018 01/21/15   Pucilowska, Herma Ard B, MD  lidocaine (LIDODERM) 5 % Place 1 patch onto the skin daily. Remove & Discard patch within 12 hours or as directed by MD Patient not taking: Reported on 01/03/2018 05/12/17   Joy, Helane Gunther, PA-C  meloxicam (MOBIC) 7.5 MG tablet Take 1 tablet (7.5 mg total) by mouth daily. Patient not taking: Reported on 01/03/2018 12/16/16   McDonald, Maree Erie A, PA-C  methocarbamol (ROBAXIN) 500 MG tablet Take 1 tablet (500 mg total) by mouth 2 (two) times daily. Patient not taking: Reported on 01/03/2018 05/12/17   Joy, Raquel Sarna C, PA-C  naproxen (NAPROSYN) 500 MG tablet Take 1 tablet (500 mg total) by mouth 2 (two) times daily. Patient not taking: Reported on 01/03/2018 05/12/17   Joy, Shawn C, PA-C  OLANZapine (ZYPREXA) 5 MG tablet Take 1 tablet (5 mg total) by mouth at bedtime. Patient not taking: Reported on 01/03/2018 01/21/15   Orson Slick  B, MD  pantoprazole (PROTONIX) 20 MG tablet Take 1 tablet (20 mg total) by mouth daily. For reflux. Patient not taking: Reported on 01/19/2015 12/11/14   Niel Hummer, NP  predniSONE (STERAPRED UNI-PAK 21 TAB) 10 MG (21) TBPK tablet Take 6 tabs (60mg ) on day 1, 5 tabs (50mg ) on day 2, 4 tabs (40mg ) on day 3, 3 tabs (30mg ) on day 4, 2 tabs (20mg ) on day 5, and 1 tab (10mg ) on day 6. Patient not taking: Reported on 01/03/2018 05/12/17   Lorayne Bender, PA-C  traZODone (DESYREL) 50 MG tablet Take 1 tablet (50 mg total) by mouth at bedtime and may repeat dose one time if  needed. Patient not taking: Reported on 01/03/2018 01/21/15   Clovis Fredrickson, MD    Family History No family history on file.  Social History Social History   Tobacco Use  . Smoking status: Current Every Day Smoker    Packs/day: 0.25    Types: Cigarettes  . Smokeless tobacco: Never Used  Substance Use Topics  . Alcohol use: Yes    Comment: 2-3 beers a week  . Drug use: No    Comment: drug screen positive for THC     Allergies   Vicodin [hydrocodone-acetaminophen]   Review of Systems Review of Systems  Constitutional: Positive for appetite change, fatigue and unexpected weight change. Negative for chills and fever.  HENT: Negative.   Respiratory: Negative.   Cardiovascular: Negative.   Gastrointestinal: Positive for vomiting.  Musculoskeletal: Negative.   Skin: Negative.   Neurological: Negative.      Physical Exam Updated Vital Signs BP 112/70 (BP Location: Right Arm)   Pulse 75   Temp 97.6 F (36.4 C) (Oral)   Resp 18   Ht 5\' 7"  (1.702 m)   Wt 74.4 kg (164 lb)   SpO2 100%   BMI 25.69 kg/m   Physical Exam  Constitutional: He is oriented to person, place, and time. He appears well-developed and well-nourished.  HENT:  Head: Normocephalic.  Mouth/Throat: Oropharynx is clear and moist.  Neck: Normal range of motion. Neck supple.  Cardiovascular: Normal rate and regular rhythm.  No murmur heard. Pulmonary/Chest: Effort normal and breath sounds normal. He has no wheezes. He has no rales.  Abdominal: Soft. Bowel sounds are normal. There is no tenderness. There is no rebound and no guarding.  Musculoskeletal: Normal range of motion.  Neurological: He is alert and oriented to person, place, and time.  Skin: Skin is warm and dry. No rash noted.  Psychiatric: He has a normal mood and affect.  Nursing note and vitals reviewed.    ED Treatments / Results  Labs (all labs ordered are listed, but only abnormal results are displayed) Labs Reviewed   URINALYSIS, ROUTINE W REFLEX MICROSCOPIC - Abnormal; Notable for the following components:      Result Value   Hgb urine dipstick MODERATE (*)    Leukocytes, UA TRACE (*)    All other components within normal limits  LIPASE, BLOOD  COMPREHENSIVE METABOLIC PANEL  CBC   Results for orders placed or performed during the hospital encounter of 01/02/18  Lipase, blood  Result Value Ref Range   Lipase 47 11 - 51 U/L  Comprehensive metabolic panel  Result Value Ref Range   Sodium 138 135 - 145 mmol/L   Potassium 3.6 3.5 - 5.1 mmol/L   Chloride 108 101 - 111 mmol/L   CO2 24 22 - 32 mmol/L   Glucose, Bld 90 65 -  99 mg/dL   BUN 10 6 - 20 mg/dL   Creatinine, Ser 0.88 0.61 - 1.24 mg/dL   Calcium 8.9 8.9 - 10.3 mg/dL   Total Protein 6.9 6.5 - 8.1 g/dL   Albumin 4.0 3.5 - 5.0 g/dL   AST 22 15 - 41 U/L   ALT 17 17 - 63 U/L   Alkaline Phosphatase 63 38 - 126 U/L   Total Bilirubin 1.1 0.3 - 1.2 mg/dL   GFR calc non Af Amer >60 >60 mL/min   GFR calc Af Amer >60 >60 mL/min   Anion gap 6 5 - 15  CBC  Result Value Ref Range   WBC 9.4 4.0 - 10.5 K/uL   RBC 5.01 4.22 - 5.81 MIL/uL   Hemoglobin 13.8 13.0 - 17.0 g/dL   HCT 42.4 39.0 - 52.0 %   MCV 84.6 78.0 - 100.0 fL   MCH 27.5 26.0 - 34.0 pg   MCHC 32.5 30.0 - 36.0 g/dL   RDW 14.8 11.5 - 15.5 %   Platelets 209 150 - 400 K/uL  Urinalysis, Routine w reflex microscopic  Result Value Ref Range   Color, Urine YELLOW YELLOW   APPearance CLEAR CLEAR   Specific Gravity, Urine 1.030 1.005 - 1.030   pH 5.0 5.0 - 8.0   Glucose, UA NEGATIVE NEGATIVE mg/dL   Hgb urine dipstick MODERATE (A) NEGATIVE   Bilirubin Urine NEGATIVE NEGATIVE   Ketones, ur NEGATIVE NEGATIVE mg/dL   Protein, ur NEGATIVE NEGATIVE mg/dL   Nitrite NEGATIVE NEGATIVE   Leukocytes, UA TRACE (A) NEGATIVE   RBC / HPF 11-20 0 - 5 RBC/hpf   WBC, UA 6-10 0 - 5 WBC/hpf   Bacteria, UA NONE SEEN NONE SEEN   Mucus PRESENT      EKG None  Radiology Dg Abd 2 Views  Result  Date: 01/03/2018 CLINICAL DATA:  Continued fatigue and daily vomiting. EXAM: ABDOMEN - 2 VIEW COMPARISON:  None. FINDINGS: Nonobstructed, nondistended bowel gas pattern. Moderate amount of stool in the rectosigmoid. There is no evidence of free air. No radio-opaque calculi or other significant radiographic abnormality is seen. IMPRESSION: Nonobstructed, nondistended bowel gas pattern. Moderate stool burden in the rectosigmoid. Electronically Signed   By: Ashley Royalty M.D.   On: 01/03/2018 03:53    Procedures Procedures (including critical care time)  Medications Ordered in ED Medications - No data to display   Initial Impression / Assessment and Plan / ED Course  I have reviewed the triage vital signs and the nursing notes.  Pertinent labs & imaging results that were available during my care of the patient were reviewed by me and considered in my medical decision making (see chart for details).     Patient with several months history of anorexia, vomiting, weight loss, worse in the last month presents with help with fatigue. He has seen his PCP and has work up started with her.   Labs and imaging here are concerning only for moderate constipation. His exam is benign. He is unable to stated why he's here except "I need a way to get some energy."  He is considered stable for discharge home to follow up with Dr. Sabra Heck   Final Clinical Impressions(s) / ED Diagnoses   Final diagnoses:  Vomiting  Weight loss    ED Discharge Orders    None       Charlann Lange, PA-C 01/03/18 0435    Duffy Bruce, MD 01/03/18 1031

## 2018-01-03 NOTE — ED Notes (Signed)
Patient transported to X-ray 

## 2018-01-03 NOTE — ED Notes (Signed)
No answer x1 for room assignment.

## 2018-01-03 NOTE — ED Notes (Signed)
Pt. stated "If Im not brought back to a room by 3 am Ive got to leave". Pt. Advised that we are doing our best to get them back to see a provider.

## 2018-01-03 NOTE — Discharge Instructions (Addendum)
Call to make an appointment with Dr. Sabra Heck to go over your test results and to determine what other outpatient evaluation is required.

## 2018-01-03 NOTE — ED Notes (Signed)
Discharge instructions discussed with Pt. Pt verbalized understanding. Pt stable and ambulatory.    

## 2018-01-29 DIAGNOSIS — R899 Unspecified abnormal finding in specimens from other organs, systems and tissues: Secondary | ICD-10-CM | POA: Diagnosis not present

## 2018-01-29 DIAGNOSIS — R112 Nausea with vomiting, unspecified: Secondary | ICD-10-CM | POA: Diagnosis not present

## 2018-02-12 ENCOUNTER — Emergency Department (HOSPITAL_COMMUNITY)
Admission: EM | Admit: 2018-02-12 | Discharge: 2018-02-12 | Disposition: A | Discharge status: Home / Self Care | Payer: 59 | Attending: Emergency Medicine | Admitting: Emergency Medicine

## 2018-02-12 ENCOUNTER — Other Ambulatory Visit: Payer: Self-pay

## 2018-02-12 ENCOUNTER — Encounter (HOSPITAL_COMMUNITY): Payer: Self-pay

## 2018-02-12 DIAGNOSIS — F17210 Nicotine dependence, cigarettes, uncomplicated: Secondary | ICD-10-CM | POA: Insufficient documentation

## 2018-02-12 DIAGNOSIS — Z202 Contact with and (suspected) exposure to infections with a predominantly sexual mode of transmission: Secondary | ICD-10-CM | POA: Insufficient documentation

## 2018-02-12 DIAGNOSIS — E860 Dehydration: Secondary | ICD-10-CM | POA: Diagnosis not present

## 2018-02-12 DIAGNOSIS — R591 Generalized enlarged lymph nodes: Secondary | ICD-10-CM | POA: Diagnosis not present

## 2018-02-12 DIAGNOSIS — R5383 Other fatigue: Secondary | ICD-10-CM | POA: Insufficient documentation

## 2018-02-12 LAB — BASIC METABOLIC PANEL
ANION GAP: 7 (ref 5–15)
BUN: 8 mg/dL (ref 6–20)
CHLORIDE: 106 mmol/L (ref 98–111)
CO2: 25 mmol/L (ref 22–32)
Calcium: 8.8 mg/dL — ABNORMAL LOW (ref 8.9–10.3)
Creatinine, Ser: 1.15 mg/dL (ref 0.61–1.24)
GFR calc non Af Amer: >60 mL/min (ref >60)
Glucose, Bld: 120 mg/dL — ABNORMAL HIGH (ref 70–99)
POTASSIUM: 3.7 mmol/L (ref 3.5–5.1)
Sodium: 138 mmol/L (ref 135–145)

## 2018-02-12 LAB — URINALYSIS, ROUTINE W REFLEX MICROSCOPIC
BILIRUBIN URINE: NEGATIVE
GLUCOSE, UA: NEGATIVE mg/dL
HGB URINE DIPSTICK: NEGATIVE
Ketones, ur: NEGATIVE mg/dL
NITRITE: NEGATIVE
PROTEIN: NEGATIVE mg/dL
Specific Gravity, Urine: 1.02 (ref 1.005–1.030)
pH: 6 (ref 5.0–8.0)

## 2018-02-12 LAB — CBC
HEMATOCRIT: 43.3 % (ref 39.0–52.0)
HEMOGLOBIN: 14.1 g/dL (ref 13.0–17.0)
MCH: 27.6 pg (ref 26.0–34.0)
MCHC: 32.6 g/dL (ref 30.0–36.0)
MCV: 84.9 fL (ref 78.0–100.0)
Platelets: 181 10*3/uL (ref 150–400)
RBC: 5.1 MIL/uL (ref 4.22–5.81)
RDW: 14.2 % (ref 11.5–15.5)
WBC: 11.1 10*3/uL — AB (ref 4.0–10.5)

## 2018-02-12 NOTE — ED Notes (Signed)
Pt verbalizes understanding of d/c instructions. Pt ambulatory at d/c with all belongings.   

## 2018-02-12 NOTE — Discharge Instructions (Signed)
Please read attached information. If you experience any new or worsening signs or symptoms please return to the emergency room for evaluation. Please follow-up with your primary care provider or specialist as discussed.  °

## 2018-02-12 NOTE — ED Provider Notes (Signed)
Patient placed in Quick Look pathway, seen and evaluated   Chief Complaint: Fatigue, dehydration, swollen inguinal lymph nodes  HPI:   Patient reports he works in [employer] and despite drinking lots of water he is felt very dehydrated and fatigued over the last few days.  He is also noticed 3 swollen lymph nodes over the left inguinal area, reports he recently had unprotected sex and is concerned about possible STD.  Denies dysuria or penile discharge.  ROS: + Fatigue, dehydration, swollen lymph nodes. -Chest pain, shortness of breath, abdominal pain, dysuria  Physical Exam:   Gen: No distress  Neuro: Awake and Alert  Skin: Warm    Focused Exam: Heart with regular rate and rhythm, lungs clear to auscultation, abdomen without tenderness   Initiation of care has begun. The patient has been counseled on the process, plan, and necessity for staying for the completion/evaluation, and the remainder of the medical screening examination    Janet Berlin 02/12/18 1846    Jola Schmidt, MD 02/12/18 2336

## 2018-02-12 NOTE — ED Provider Notes (Signed)
[facility] EMERGENCY DEPARTMENT Provider Note   CSN: [id number] Arrival date & time: 02/12/18  1810   History   Chief Complaint Chief Complaint  Patient presents with  . Fatigue  . SEXUALLY TRANSMITTED DISEASE    HPI Clinton Dean is a 32 y.o. male.  HPI    43 YOM presents today with complaints of fatigue. Patient notes several months of generalized fatigue, waking up in the morning without energy. He notes he is able to exercise and go to work but has not been himself. Patient denies any fever or infectious etiology. He does note recent lymphadenopathy in the left groin. He notes he has discussed this with his primary care provider who is following along. Patient is requesting STD testing, he notes he does use condoms but had sex with someone that he has not had sex with before. Patient denies any penile discharge, testicular pain and swelling or masses.    Past Medical History:  Diagnosis Date  . Bipolar 1 disorder ([place])   . Schizophrenia [facility])     Patient Active Problem List   Diagnosis Date Noted  . Suicide attempt ([place]) 01/19/2015  . Cannabis dependence ([place])   . MDD (major depressive disorder), recurrent severe, without psychosis ([place]) 12/09/2014  . Suicide attempt by drug ingestion ([place]) 12/09/2014  . Bipolar 1 disorder, mixed, severe ([place]) 12/08/2014  . Cannabis dependence, continuous ([place]) 12/08/2014    Past Surgical History:  Procedure Laterality Date  . APPENDECTOMY          Home Medications    Prior to Admission medications   Medication Sig Start Date End Date Taking? Authorizing Provider  albuterol (PROVENTIL HFA;VENTOLIN HFA) 108 (90 Base) MCG/ACT inhaler Inhale 2 puffs into the lungs every 4 (four) hours as needed for wheezing or shortness of breath. Patient not taking: Reported on 01/03/2018 08/31/17   Paulette Blanch, MD  benzonatate (TESSALON) 200 MG capsule Take 1 capsule (200 mg total) by mouth 3 (three) times daily as needed  for cough. Patient not taking: Reported on 01/03/2018 08/31/17   Paulette Blanch, MD  cyclobenzaprine (FLEXERIL) 10 MG tablet Take 1 tablet (10 mg total) by mouth 2 (two) times daily as needed for muscle spasms. Patient not taking: Reported on 01/03/2018 12/16/16   McDonald, Mia A, PA-C  diclofenac sodium (VOLTAREN) 1 % GEL Apply 4 g topically 4 (four) times daily. Patient not taking: Reported on 01/03/2018 05/12/17   Joy, Raquel Sarna C, PA-C  divalproex (DEPAKOTE) 500 MG DR tablet Take 1 tablet (500 mg total) by mouth every 12 (twelve) hours. Patient not taking: Reported on 01/03/2018 01/21/15   Pucilowska, Herma Ard B, MD  lidocaine (LIDODERM) 5 % Place 1 patch onto the skin daily. Remove & Discard patch within 12 hours or as directed by MD Patient not taking: Reported on 01/03/2018 05/12/17   Joy, Helane Gunther, PA-C  meloxicam (MOBIC) 7.5 MG tablet Take 1 tablet (7.5 mg total) by mouth daily. Patient not taking: Reported on 01/03/2018 12/16/16   McDonald, Maree Erie A, PA-C  methocarbamol (ROBAXIN) 500 MG tablet Take 1 tablet (500 mg total) by mouth 2 (two) times daily. Patient not taking: Reported on 01/03/2018 05/12/17   Joy, Raquel Sarna C, PA-C  naproxen (NAPROSYN) 500 MG tablet Take 1 tablet (500 mg total) by mouth 2 (two) times daily. Patient not taking: Reported on 01/03/2018 05/12/17   Joy, Shawn C, PA-C  OLANZapine (ZYPREXA) 5 MG tablet Take 1 tablet (5 mg total) by mouth at bedtime.  Patient not taking: Reported on 01/03/2018 01/21/15   Pucilowska, Herma Ard B, MD  pantoprazole (PROTONIX) 20 MG tablet Take 1 tablet (20 mg total) by mouth daily. For reflux. Patient not taking: Reported on 01/19/2015 12/11/14   Niel Hummer, NP  predniSONE (STERAPRED UNI-PAK 21 TAB) 10 MG (21) TBPK tablet Take 6 tabs (60mg ) on day 1, 5 tabs (50mg ) on day 2, 4 tabs (40mg ) on day 3, 3 tabs (30mg ) on day 4, 2 tabs (20mg ) on day 5, and 1 tab (10mg ) on day 6. Patient not taking: Reported on 01/03/2018 05/12/17   Lorayne Bender, PA-C  traZODone (DESYREL) 50 MG  tablet Take 1 tablet (50 mg total) by mouth at bedtime and may repeat dose one time if needed. Patient not taking: Reported on 01/03/2018 01/21/15   Clovis Fredrickson, MD    Family History History reviewed. No pertinent family history.  Social History Social History   Tobacco Use  . Smoking status: Current Every Day Smoker    Packs/day: 0.25    Types: Cigarettes  . Smokeless tobacco: Never Used  Substance Use Topics  . Alcohol use: Yes    Comment: 2-3 beers a week  . Drug use: No    Comment: drug screen positive for THC     Allergies   Vicodin [hydrocodone-acetaminophen]   Review of Systems Review of Systems  All other systems reviewed and are negative.   Physical Exam Updated Vital Signs BP 110/67   Pulse 80   Temp 98.5 F (36.9 C) (Oral)   Resp 18   Ht 5\' 11"  (1.803 m)   Wt 74.8 kg (165 lb)   SpO2 100%   BMI 23.01 kg/m   Physical Exam  Constitutional: He is oriented to person, place, and time. He appears well-developed and well-nourished.  HENT:  Head: Normocephalic and atraumatic.  Eyes: Pupils are equal, round, and reactive to light. Conjunctivae are normal. Right eye exhibits no discharge. Left eye exhibits no discharge. No scleral icterus.  Neck: Normal range of motion. No JVD present. No tracheal deviation present.  Pulmonary/Chest: Effort normal. No stridor.  Genitourinary:  Genitourinary Comments: Bilateral descended testes, (left is significantly smaller, baseline per patient) no penile discharge noted  Left inguinal region with 2 enlarged lymph nodes proximal 0.5 cm  Neurological: He is alert and oriented to person, place, and time. Coordination normal.  Psychiatric: He has a normal mood and affect. His behavior is normal. Judgment and thought content normal.  Nursing note and vitals reviewed.    ED Treatments / Results  Labs (all labs ordered are listed, but only abnormal results are displayed) Labs Reviewed  CBC - Abnormal; Notable for  the following components:      Result Value   WBC 11.1 (*)    All other components within normal limits  BASIC METABOLIC PANEL - Abnormal; Notable for the following components:   Glucose, Bld 120 (*)    Calcium 8.8 (*)    All other components within normal limits  URINALYSIS, ROUTINE W REFLEX MICROSCOPIC - Abnormal; Notable for the following components:   Leukocytes, UA TRACE (*)    Bacteria, UA RARE (*)    All other components within normal limits  HIV ANTIBODY (ROUTINE TESTING)  RPR  GC/CHLAMYDIA PROBE AMP ([place]) NOT AT [facility]    EKG None  Radiology No results found.  Procedures Procedures (including critical care time)  Medications Ordered in ED Medications - No data to display   Initial Impression /  Assessment and Plan / ED Course  I have reviewed the triage vital signs and the nursing notes.  Pertinent labs & imaging results that were available during my care of the patient were reviewed by me and considered in my medical decision making (see chart for details).     Labs: cbc, BMP, urinalysis, HIV, RPR, GC  Imaging:  Consults:  Therapeutics:  Discharge Meds:   Assessment/Plan: 32 year old male presents today for STD testing and reports of fatigue. His laboratory analysis is reassuring. No signs of infection on my exam. Patient does have lymphadenopathy, his primary care is aware of this, he will continue outpatient monitoring of this. Patient is given strict return precautions, he verbalized understanding and agreement to today's plan had no further questions or concerns.    Final Clinical Impressions(s) / ED Diagnoses   Final diagnoses:  Fatigue, unspecified type  Lymphadenopathy    ED Discharge Orders    None       Okey Regal, PA-C 02/13/18 1524    Maudie Flakes, MD 02/15/18 1705

## 2018-02-12 NOTE — ED Triage Notes (Signed)
Patient here from home for possible dehydration and fatigue, stated drinks lots of water but still feels out of it.  Also wants to be tested for STD.  Has swollen lymph nodes in left groin, no swelling or discharge.  A&Ox4.

## 2018-02-13 LAB — RPR: RPR: NONREACTIVE

## 2018-02-13 LAB — HIV ANTIBODY (ROUTINE TESTING W REFLEX): HIV SCREEN 4TH GENERATION: NONREACTIVE

## 2018-02-14 LAB — GC/CHLAMYDIA PROBE AMP (~~LOC~~) NOT AT ARMC
Chlamydia: NEGATIVE
Neisseria Gonorrhea: NEGATIVE

## 2018-07-14 ENCOUNTER — Emergency Department (HOSPITAL_COMMUNITY)
Admission: EM | Admit: 2018-07-14 | Discharge: 2018-07-14 | Disposition: A | Discharge status: Home / Self Care | Payer: 59 | Attending: Emergency Medicine | Admitting: Emergency Medicine

## 2018-07-14 ENCOUNTER — Other Ambulatory Visit: Payer: Self-pay

## 2018-07-14 ENCOUNTER — Encounter (HOSPITAL_COMMUNITY): Payer: Self-pay

## 2018-07-14 ENCOUNTER — Emergency Department (HOSPITAL_COMMUNITY): Payer: 59

## 2018-07-14 DIAGNOSIS — F209 Schizophrenia, unspecified: Secondary | ICD-10-CM | POA: Diagnosis not present

## 2018-07-14 DIAGNOSIS — R05 Cough: Secondary | ICD-10-CM | POA: Diagnosis not present

## 2018-07-14 DIAGNOSIS — F17210 Nicotine dependence, cigarettes, uncomplicated: Secondary | ICD-10-CM | POA: Insufficient documentation

## 2018-07-14 DIAGNOSIS — R5383 Other fatigue: Secondary | ICD-10-CM | POA: Insufficient documentation

## 2018-07-14 DIAGNOSIS — Z79899 Other long term (current) drug therapy: Secondary | ICD-10-CM | POA: Diagnosis not present

## 2018-07-14 DIAGNOSIS — F319 Bipolar disorder, unspecified: Secondary | ICD-10-CM | POA: Diagnosis not present

## 2018-07-14 DIAGNOSIS — R5382 Chronic fatigue, unspecified: Secondary | ICD-10-CM

## 2018-07-14 LAB — CBC WITH DIFFERENTIAL/PLATELET
ABS IMMATURE GRANULOCYTES: 0.06 10*3/uL (ref 0.00–0.07)
BASOS PCT: 2 %
Basophils Absolute: 0.2 10*3/uL — ABNORMAL HIGH (ref 0.0–0.1)
Eosinophils Absolute: 0.6 10*3/uL — ABNORMAL HIGH (ref 0.0–0.5)
Eosinophils Relative: 6 %
HCT: 43.7 % (ref 39.0–52.0)
HEMOGLOBIN: 14.1 g/dL (ref 13.0–17.0)
Immature Granulocytes: 1 %
Lymphocytes Relative: 32 %
Lymphs Abs: 2.9 10*3/uL (ref 0.7–4.0)
MCH: 27.7 pg (ref 26.0–34.0)
MCHC: 32.3 g/dL (ref 30.0–36.0)
MCV: 85.9 fL (ref 80.0–100.0)
Monocytes Absolute: 0.8 10*3/uL (ref 0.1–1.0)
Monocytes Relative: 9 %
NEUTROS ABS: 4.6 10*3/uL (ref 1.7–7.7)
Neutrophils Relative %: 50 %
Platelets: 198 10*3/uL (ref 150–400)
RBC: 5.09 MIL/uL (ref 4.22–5.81)
RDW: 14.6 % (ref 11.5–15.5)
WBC: 9.2 10*3/uL (ref 4.0–10.5)
nRBC: 0 % (ref 0.0–0.2)

## 2018-07-14 LAB — URINALYSIS, ROUTINE W REFLEX MICROSCOPIC
BILIRUBIN URINE: NEGATIVE
Bacteria, UA: NONE SEEN
GLUCOSE, UA: NEGATIVE mg/dL
KETONES UR: NEGATIVE mg/dL
LEUKOCYTES UA: NEGATIVE
NITRITE: NEGATIVE
PH: 7 (ref 5.0–8.0)
Protein, ur: NEGATIVE mg/dL
SPECIFIC GRAVITY, URINE: 1.021 (ref 1.005–1.030)

## 2018-07-14 LAB — COMPREHENSIVE METABOLIC PANEL
ALBUMIN: 4.1 g/dL (ref 3.5–5.0)
ALK PHOS: 69 U/L (ref 38–126)
ALT: 15 U/L (ref 0–44)
AST: 23 U/L (ref 15–41)
Anion gap: 7 (ref 5–15)
BILIRUBIN TOTAL: 0.5 mg/dL (ref 0.3–1.2)
BUN: 12 mg/dL (ref 6–20)
CALCIUM: 8.8 mg/dL — AB (ref 8.9–10.3)
CO2: 25 mmol/L (ref 22–32)
Chloride: 107 mmol/L (ref 98–111)
Creatinine, Ser: 0.98 mg/dL (ref 0.61–1.24)
GFR calc Af Amer: >60 mL/min (ref >60)
GLUCOSE: 89 mg/dL (ref 70–99)
POTASSIUM: 4.5 mmol/L (ref 3.5–5.1)
Sodium: 139 mmol/L (ref 135–145)
TOTAL PROTEIN: 7.1 g/dL (ref 6.5–8.1)

## 2018-07-14 LAB — RAPID HIV SCREEN (HIV 1/2 AB+AG)
HIV 1/2 ANTIBODIES: NONREACTIVE
HIV-1 P24 Antigen - HIV24: NONREACTIVE

## 2018-07-14 NOTE — Discharge Instructions (Signed)
As we discussed, follow-up with your primary care doctor.  Return emergency department for any worsening symptoms, chest pain, difficulty breathing, fever or any other worsening or concerning symptoms.

## 2018-07-14 NOTE — ED Provider Notes (Signed)
[facility] DEPT Provider Note   CSN: [id number] Arrival date & time: 07/14/18  1551     History   Chief Complaint No chief complaint on file.   HPI Clinton Dean is a 32 y.o. male past medical history of bipolar, schizophrenia who presents for evaluation of feelings of fatigue and no energy.  He reports this is been ongoing for several months and has had work-ups in the ED previously for symptoms.  Patient reports that he wakes up in the morning, he "feels like he has absolutely no energy and is already tired after sleeping all night."  Patient reports he was seen in the ED for similar and had work-up that was reassuring.  He was told to follow-up with his primary care doctor but he states he has not seen her for the last several months.  Patient states that each time he was worked up, they cannot find anything that explains his symptoms but he still continues have symptoms.  Additionally, he feels "like his intestines are all moving around" and he will sometimes have loose stools.  He denies any abdominal pain.  He is able to eat and drink without any difficulty.  He does report that in the mornings, he is very nauseous.  He has not noted any fevers.  Patient states he has not noted any drastic weight loss over the last several months.  He has not had any night sweats.  Patient also reports that he is coughing and reports cough is productive of mucus.  No hemoptysis.  He states that his "lungs will hurt" but is in a different spot.  He does report that he smokes approximately 1 pack of cigarettes every 2 to 3 days.  Patient denies any chest pain, numbness/weakness of his arms or legs.  Patient reports he is currently sexually active with one partner.    The history is provided by the patient.    Past Medical History:  Diagnosis Date  . Bipolar 1 disorder ([place])   . Schizophrenia [facility])     Patient Active Problem List   Diagnosis Date Noted  . Suicide  attempt ([place]) 01/19/2015  . Cannabis dependence ([place])   . MDD (major depressive disorder), recurrent severe, without psychosis ([place]) 12/09/2014  . Suicide attempt by drug ingestion ([place]) 12/09/2014  . Bipolar 1 disorder, mixed, severe ([place]) 12/08/2014  . Cannabis dependence, continuous ([place]) 12/08/2014    Past Surgical History:  Procedure Laterality Date  . APPENDECTOMY          Home Medications    Prior to Admission medications   Medication Sig Start Date End Date Taking? Authorizing Provider  albuterol (PROVENTIL HFA;VENTOLIN HFA) 108 (90 Base) MCG/ACT inhaler Inhale 2 puffs into the lungs every 4 (four) hours as needed for wheezing or shortness of breath. Patient not taking: Reported on 01/03/2018 08/31/17   Paulette Blanch, MD  benzonatate (TESSALON) 200 MG capsule Take 1 capsule (200 mg total) by mouth 3 (three) times daily as needed for cough. Patient not taking: Reported on 01/03/2018 08/31/17   Paulette Blanch, MD  cyclobenzaprine (FLEXERIL) 10 MG tablet Take 1 tablet (10 mg total) by mouth 2 (two) times daily as needed for muscle spasms. Patient not taking: Reported on 01/03/2018 12/16/16   McDonald, Mia A, PA-C  diclofenac sodium (VOLTAREN) 1 % GEL Apply 4 g topically 4 (four) times daily. Patient not taking: Reported on 01/03/2018 05/12/17   Joy, Raquel Sarna C, PA-C  divalproex (DEPAKOTE) 500 MG  DR tablet Take 1 tablet (500 mg total) by mouth every 12 (twelve) hours. Patient not taking: Reported on 01/03/2018 01/21/15   Pucilowska, Herma Ard B, MD  lidocaine (LIDODERM) 5 % Place 1 patch onto the skin daily. Remove & Discard patch within 12 hours or as directed by MD Patient not taking: Reported on 01/03/2018 05/12/17   Joy, Helane Gunther, PA-C  meloxicam (MOBIC) 7.5 MG tablet Take 1 tablet (7.5 mg total) by mouth daily. Patient not taking: Reported on 01/03/2018 12/16/16   McDonald, Maree Erie A, PA-C  methocarbamol (ROBAXIN) 500 MG tablet Take 1 tablet (500 mg total) by mouth 2 (two) times daily. Patient not  taking: Reported on 01/03/2018 05/12/17   Joy, Raquel Sarna C, PA-C  naproxen (NAPROSYN) 500 MG tablet Take 1 tablet (500 mg total) by mouth 2 (two) times daily. Patient not taking: Reported on 01/03/2018 05/12/17   Joy, Shawn C, PA-C  OLANZapine (ZYPREXA) 5 MG tablet Take 1 tablet (5 mg total) by mouth at bedtime. Patient not taking: Reported on 01/03/2018 01/21/15   Pucilowska, Herma Ard B, MD  pantoprazole (PROTONIX) 20 MG tablet Take 1 tablet (20 mg total) by mouth daily. For reflux. Patient not taking: Reported on 01/19/2015 12/11/14   Niel Hummer, NP  predniSONE (STERAPRED UNI-PAK 21 TAB) 10 MG (21) TBPK tablet Take 6 tabs (60mg ) on day 1, 5 tabs (50mg ) on day 2, 4 tabs (40mg ) on day 3, 3 tabs (30mg ) on day 4, 2 tabs (20mg ) on day 5, and 1 tab (10mg ) on day 6. Patient not taking: Reported on 01/03/2018 05/12/17   Lorayne Bender, PA-C  traZODone (DESYREL) 50 MG tablet Take 1 tablet (50 mg total) by mouth at bedtime and may repeat dose one time if needed. Patient not taking: Reported on 01/03/2018 01/21/15   Clovis Fredrickson, MD    Family History No family history on file.  Social History Social History   Tobacco Use  . Smoking status: Current Every Day Smoker    Packs/day: 0.25    Types: Cigarettes  . Smokeless tobacco: Never Used  Substance Use Topics  . Alcohol use: Yes    Comment: 2-3 beers a week  . Drug use: No    Comment: drug screen positive for THC     Allergies   Vicodin [hydrocodone-acetaminophen]   Review of Systems Review of Systems  Constitutional: Positive for fatigue. Negative for diaphoresis, fever and unexpected weight change.  Respiratory: Negative for cough and shortness of breath.   Cardiovascular: Negative for chest pain.  Gastrointestinal: Negative for abdominal pain, nausea and vomiting.  Genitourinary: Negative for dysuria and hematuria.  Neurological: Negative for weakness, numbness and headaches.  All other systems reviewed and are negative.    Physical  Exam Updated Vital Signs BP 122/76   Pulse 86   Temp 98.1 F (36.7 C) (Oral)   Resp 18   Ht 5\' 7"  (1.702 m)   Wt 79.4 kg   SpO2 99%   BMI 27.41 kg/m   Physical Exam Vitals signs and nursing note reviewed.  Constitutional:      Appearance: Normal appearance. He is well-developed.  HENT:     Head: Normocephalic and atraumatic.  Eyes:     General: Lids are normal.     Conjunctiva/sclera: Conjunctivae normal.     Pupils: Pupils are equal, round, and reactive to light.  Neck:     Musculoskeletal: Full passive range of motion without pain.  Cardiovascular:     Rate and Rhythm: Normal  rate and regular rhythm.     Pulses: Normal pulses.          Radial pulses are 2+ on the right side and 2+ on the left side.     Heart sounds: Normal heart sounds. No murmur. No friction rub. No gallop.   Pulmonary:     Effort: Pulmonary effort is normal.     Breath sounds: Normal breath sounds.     Comments: Lungs clear to auscultation bilaterally.  Symmetric chest rise.  No wheezing, rales, rhonchi. Abdominal:     Palpations: Abdomen is soft. Abdomen is not rigid.     Tenderness: There is no abdominal tenderness. There is no guarding.     Comments: Abdomen is soft, non-distended, non-tender. No rigidity, No guarding. No peritoneal signs.  Musculoskeletal: Normal range of motion.  Skin:    General: Skin is warm and dry.     Capillary Refill: Capillary refill takes less than 2 seconds.  Neurological:     Mental Status: He is alert and oriented to person, place, and time.     Comments: Cranial nerves III-XII intact Follows commands, Moves all extremities  5/5 strength to BUE and BLE  Sensation intact throughout all major nerve distributions Normal finger to nose. No dysdiadochokinesia. No pronator drift. No gait abnormalities  No slurred speech. No facial droop.   Psychiatric:        Speech: Speech normal.      ED Treatments / Results  Labs (all labs ordered are listed, but only  abnormal results are displayed) Labs Reviewed  URINALYSIS, ROUTINE W REFLEX MICROSCOPIC - Abnormal; Notable for the following components:      Result Value   Hgb urine dipstick SMALL (*)    All other components within normal limits  COMPREHENSIVE METABOLIC PANEL - Abnormal; Notable for the following components:   Calcium 8.8 (*)    All other components within normal limits  CBC WITH DIFFERENTIAL/PLATELET - Abnormal; Notable for the following components:   Eosinophils Absolute 0.6 (*)    Basophils Absolute 0.2 (*)    All other components within normal limits  RAPID HIV SCREEN (HIV 1/2 AB+AG)    EKG None  Radiology Dg Chest 2 View  Result Date: 07/14/2018 CLINICAL DATA:  Cough and chest congestion. EXAM: CHEST - 2 VIEW COMPARISON:  08/31/2017 FINDINGS: The heart size and mediastinal contours are within normal limits. Both lungs are clear except for peribronchial thickening. The visualized skeletal structures are unremarkable. IMPRESSION: Bronchitic changes. Electronically Signed   By: Lorriane Shire M.D.   On: 07/14/2018 18:31    Procedures Procedures (including critical care time)  Medications Ordered in ED Medications - No data to display   Initial Impression / Assessment and Plan / ED Course  I have reviewed the triage vital signs and the nursing notes.  Pertinent labs & imaging results that were available during my care of the patient were reviewed by me and considered in my medical decision making (see chart for details).     32 year old who presents for evaluation of fatigue that is been ongoing for the last several months.  He reports he has been seen in the ED for symptoms previously with no finding.  He states he has not followed up with his PCP since July 2018.  He denies any changes or worsening in symptoms.  He denies any fevers.  He does report he has had some cough symptoms over the last few days. Patient is afebrile, non-toxic appearing, sitting comfortably  on  examination table. Vital signs reviewed and stable.  Normal neuro exam.  Will check basic labs, including HIV given vague complaint of fatigue.  Additionally, he has had some cough.  No fevers.  Lungs clear to auscultation.  We will plan for chest x-ray to eval for any infectious process.  History/physical exam is not concerning for CVA, spinal abscess, cauda equina.  Given duration of symptoms, do not suspect rhabdomyolysis.  CBC without any significant leukocytosis or anemia.  He had previously had some thrombocytopenia but today his platelets appear normal.  CMP is unremarkable.  UA negative for any infectious etiology.  He does have some hemoglobin but does have RBCs.  HIV negative.  Discussed results with patient. At this time, patient exhibits no emergent life-threatening condition that require further evaluation in ED or admission.  Instructed him to follow-up with his primary care doctor for further evaluation. Patient had ample opportunity for questions and discussion. All patient's questions were answered with full understanding. Strict return precautions discussed. Patient expresses understanding and agreement to plan.    Portions of this note were generated with [employer]. Dictation errors may occur despite best attempts at proofreading.  Final Clinical Impressions(s) / ED Diagnoses   Final diagnoses:  Chronic fatigue    ED Discharge Orders    None       Desma Mcgregor 07/14/18 2101    Little, Wenda Overland, MD 07/15/18 2358

## 2018-07-14 NOTE — ED Triage Notes (Signed)
Patient here with c/o generalized body aches and congestion. Pt state he is coughing up yellowish sputum.

## 2018-11-23 DIAGNOSIS — R1084 Generalized abdominal pain: Secondary | ICD-10-CM | POA: Diagnosis not present

## 2018-11-23 DIAGNOSIS — R5383 Other fatigue: Secondary | ICD-10-CM | POA: Diagnosis not present

## 2018-11-23 DIAGNOSIS — M2550 Pain in unspecified joint: Secondary | ICD-10-CM | POA: Diagnosis not present

## 2018-11-23 DIAGNOSIS — R946 Abnormal results of thyroid function studies: Secondary | ICD-10-CM | POA: Diagnosis not present

## 2018-11-25 ENCOUNTER — Emergency Department (HOSPITAL_COMMUNITY)
Admission: EM | Admit: 2018-11-25 | Discharge: 2018-11-25 | Disposition: A | Discharge status: Home / Self Care | Payer: 59 | Attending: Emergency Medicine | Admitting: Emergency Medicine

## 2018-11-25 ENCOUNTER — Other Ambulatory Visit: Payer: Self-pay

## 2018-11-25 ENCOUNTER — Emergency Department (HOSPITAL_COMMUNITY): Payer: 59

## 2018-11-25 ENCOUNTER — Encounter (HOSPITAL_COMMUNITY): Payer: Self-pay | Admitting: Emergency Medicine

## 2018-11-25 DIAGNOSIS — M546 Pain in thoracic spine: Secondary | ICD-10-CM | POA: Insufficient documentation

## 2018-11-25 DIAGNOSIS — F17210 Nicotine dependence, cigarettes, uncomplicated: Secondary | ICD-10-CM | POA: Insufficient documentation

## 2018-11-25 DIAGNOSIS — R1013 Epigastric pain: Secondary | ICD-10-CM | POA: Insufficient documentation

## 2018-11-25 DIAGNOSIS — B353 Tinea pedis: Secondary | ICD-10-CM | POA: Diagnosis not present

## 2018-11-25 DIAGNOSIS — Z79899 Other long term (current) drug therapy: Secondary | ICD-10-CM | POA: Insufficient documentation

## 2018-11-25 DIAGNOSIS — R109 Unspecified abdominal pain: Secondary | ICD-10-CM | POA: Diagnosis present

## 2018-11-25 LAB — LIPASE, BLOOD: Lipase: 28 U/L (ref 11–51)

## 2018-11-25 LAB — COMPREHENSIVE METABOLIC PANEL
ALT: 16 U/L (ref 0–44)
AST: 24 U/L (ref 15–41)
Albumin: 3.8 g/dL (ref 3.5–5.0)
Alkaline Phosphatase: 75 U/L (ref 38–126)
Anion gap: 9 (ref 5–15)
BUN: 10 mg/dL (ref 6–20)
CO2: 23 mmol/L (ref 22–32)
Calcium: 9.1 mg/dL (ref 8.9–10.3)
Chloride: 107 mmol/L (ref 98–111)
Creatinine, Ser: 0.97 mg/dL (ref 0.61–1.24)
GFR calc Af Amer: >60 mL/min (ref >60)
GFR calc non Af Amer: >60 mL/min (ref >60)
Glucose, Bld: 94 mg/dL (ref 70–99)
Potassium: 4.1 mmol/L (ref 3.5–5.1)
Sodium: 139 mmol/L (ref 135–145)
Total Bilirubin: 1 mg/dL (ref 0.3–1.2)
Total Protein: 6.5 g/dL (ref 6.5–8.1)

## 2018-11-25 LAB — CBC WITH DIFFERENTIAL/PLATELET
Abs Immature Granulocytes: 0.03 K/uL (ref 0.00–0.07)
Basophils Absolute: 0.2 K/uL — ABNORMAL HIGH (ref 0.0–0.1)
Basophils Relative: 2 %
Eosinophils Absolute: 0.5 K/uL (ref 0.0–0.5)
Eosinophils Relative: 5 %
HCT: 42 % (ref 39.0–52.0)
Hemoglobin: 13.8 g/dL (ref 13.0–17.0)
Immature Granulocytes: 0 %
Lymphocytes Relative: 23 %
Lymphs Abs: 2.2 K/uL (ref 0.7–4.0)
MCH: 27.5 pg (ref 26.0–34.0)
MCHC: 32.9 g/dL (ref 30.0–36.0)
MCV: 83.7 fL (ref 80.0–100.0)
Monocytes Absolute: 0.8 K/uL (ref 0.1–1.0)
Monocytes Relative: 8 %
Neutro Abs: 5.9 K/uL (ref 1.7–7.7)
Neutrophils Relative %: 62 %
Platelets: 182 K/uL (ref 150–400)
RBC: 5.02 MIL/uL (ref 4.22–5.81)
RDW: 14.2 % (ref 11.5–15.5)
WBC: 9.5 K/uL (ref 4.0–10.5)
nRBC: 0 % (ref 0.0–0.2)

## 2018-11-25 MED ORDER — NAPROXEN 500 MG PO TABS: 500.0000 mg | ORAL_TABLET | Freq: Two times a day (BID) | ORAL | 0 refills | Status: AC

## 2018-11-25 MED ORDER — METHOCARBAMOL 500 MG PO TABS
500.0000 mg | ORAL_TABLET | Freq: Once | ORAL | Status: AC
  Administered 2018-11-25: 500 mg via ORAL
  Filled 2018-11-25: qty 1

## 2018-11-25 MED ORDER — KETOROLAC TROMETHAMINE 30 MG/ML IJ SOLN
30.0000 mg | Freq: Once | INTRAMUSCULAR | Status: AC
  Administered 2018-11-25: 30 mg via INTRAVENOUS
  Filled 2018-11-25: qty 1

## 2018-11-25 MED ORDER — LIDOCAINE 5 % EX PTCH
1.0000 | MEDICATED_PATCH | CUTANEOUS | Status: DC
  Administered 2018-11-25: 16:00:00 1 via TRANSDERMAL
  Filled 2018-11-25: qty 1

## 2018-11-25 MED ORDER — METHOCARBAMOL 500 MG PO TABS: 500.0000 mg | ORAL_TABLET | Freq: Two times a day (BID) | ORAL | 0 refills | Status: DC

## 2018-11-25 MED ORDER — TERBINAFINE HCL 1 % EX CREA: 1.0000 "application " | TOPICAL_CREAM | Freq: Two times a day (BID) | CUTANEOUS | 0 refills | Status: AC

## 2018-11-25 MED ORDER — ONDANSETRON HCL 4 MG/2ML IJ SOLN
4.0000 mg | Freq: Once | INTRAMUSCULAR | Status: AC
  Administered 2018-11-25: 4 mg via INTRAVENOUS
  Filled 2018-11-25: qty 2

## 2018-11-25 MED ORDER — SODIUM CHLORIDE 0.9 % IV BOLUS
1000.0000 mL | Freq: Once | INTRAVENOUS | Status: AC
  Administered 2018-11-25: 1000 mL via INTRAVENOUS

## 2018-11-25 MED ORDER — DICYCLOMINE HCL 10 MG/ML IM SOLN
20.0000 mg | Freq: Once | INTRAMUSCULAR | Status: AC
  Administered 2018-11-25: 14:00:00 20 mg via INTRAMUSCULAR
  Filled 2018-11-25: qty 2

## 2018-11-25 NOTE — ED Provider Notes (Signed)
[facility] EMERGENCY DEPARTMENT Provider Note   CSN: [id number] Arrival date & time: 11/25/18  1253    History   Chief Complaint Chief Complaint  Patient presents with  . Abdominal Pain    HPI Clinton Dean is a 33 y.o. male.     Clinton Dean is a 33 y.o. male with a history of bipolar disorder and schizophrenia, who presents to the emergency department for evaluation of epigastric abdominal pain.  Patient reports this pain began about 1 hour prior to arrival.  He reports associated nausea but no emesis.  No hematemesis, melena hematochezia.  Normal bowel movements.  No fevers or chills.  No lower abdominal pain.  No urinary symptoms.  Pain does radiate towards the patient's back.  Patient initially does not report a specific trigger for the pain but later states that pain started after he was lifting a 50 to 60 pound propane tank.  He does not have any associated chest pain or shortness of breath.  No cough no extremity numbness weakness or tingling.  No prior history of hypertension.  Hx of appendectomy, no other abdominal surgeries.  Denies alcohol or NSAID use.     Past Medical History:  Diagnosis Date  . Bipolar 1 disorder ([place])   . Schizophrenia [facility])     Patient Active Problem List   Diagnosis Date Noted  . Suicide attempt ([place]) 01/19/2015  . Cannabis dependence ([place])   . MDD (major depressive disorder), recurrent severe, without psychosis ([place]) 12/09/2014  . Suicide attempt by drug ingestion ([place]) 12/09/2014  . Bipolar 1 disorder, mixed, severe ([place]) 12/08/2014  . Cannabis dependence, continuous ([place]) 12/08/2014    Past Surgical History:  Procedure Laterality Date  . APPENDECTOMY          Home Medications    Prior to Admission medications   Medication Sig Start Date End Date Taking? Authorizing Provider  naproxen sodium (ALEVE) 220 MG tablet Take 220-440 mg by mouth as needed (pain or headache).   Yes [provider]   albuterol (PROVENTIL HFA;VENTOLIN HFA) 108 (90 Base) MCG/ACT inhaler Inhale 2 puffs into the lungs every 4 (four) hours as needed for wheezing or shortness of breath. Patient not taking: Reported on 01/03/2018 08/31/17   Paulette Blanch, MD  benzonatate (TESSALON) 200 MG capsule Take 1 capsule (200 mg total) by mouth 3 (three) times daily as needed for cough. Patient not taking: Reported on 01/03/2018 08/31/17   Paulette Blanch, MD  cyclobenzaprine (FLEXERIL) 10 MG tablet Take 1 tablet (10 mg total) by mouth 2 (two) times daily as needed for muscle spasms. Patient not taking: Reported on 01/03/2018 12/16/16   McDonald, Mia A, PA-C  diclofenac sodium (VOLTAREN) 1 % GEL Apply 4 g topically 4 (four) times daily. Patient not taking: Reported on 01/03/2018 05/12/17   Joy, Raquel Sarna C, PA-C  divalproex (DEPAKOTE) 500 MG DR tablet Take 1 tablet (500 mg total) by mouth every 12 (twelve) hours. Patient not taking: Reported on 01/03/2018 01/21/15   Pucilowska, Herma Ard B, MD  lidocaine (LIDODERM) 5 % Place 1 patch onto the skin daily. Remove & Discard patch within 12 hours or as directed by MD Patient not taking: Reported on 01/03/2018 05/12/17   Joy, Helane Gunther, PA-C  meloxicam (MOBIC) 7.5 MG tablet Take 1 tablet (7.5 mg total) by mouth daily. Patient not taking: Reported on 01/03/2018 12/16/16   McDonald, Maree Erie A, PA-C  methocarbamol (ROBAXIN) 500 MG tablet Take 1 tablet (500 mg total) by  mouth 2 (two) times daily. 11/25/18   Jacqlyn Larsen, PA-C  naproxen (NAPROSYN) 500 MG tablet Take 1 tablet (500 mg total) by mouth 2 (two) times daily. 11/25/18   Jacqlyn Larsen, PA-C  OLANZapine (ZYPREXA) 5 MG tablet Take 1 tablet (5 mg total) by mouth at bedtime. Patient not taking: Reported on 01/03/2018 01/21/15   Pucilowska, Herma Ard B, MD  pantoprazole (PROTONIX) 20 MG tablet Take 1 tablet (20 mg total) by mouth daily. For reflux. Patient not taking: Reported on 01/19/2015 12/11/14   Niel Hummer, NP  predniSONE (STERAPRED UNI-PAK 21 TAB) 10 MG (21)  TBPK tablet Take 6 tabs (60mg ) on day 1, 5 tabs (50mg ) on day 2, 4 tabs (40mg ) on day 3, 3 tabs (30mg ) on day 4, 2 tabs (20mg ) on day 5, and 1 tab (10mg ) on day 6. Patient not taking: Reported on 01/03/2018 05/12/17   Joy, Raquel Sarna C, PA-C  terbinafine (LAMISIL) 1 % cream Apply 1 application topically 2 (two) times daily. 11/25/18   Jacqlyn Larsen, PA-C  traZODone (DESYREL) 50 MG tablet Take 1 tablet (50 mg total) by mouth at bedtime and may repeat dose one time if needed. Patient not taking: Reported on 01/03/2018 01/21/15   Clovis Fredrickson, MD    Family History No family history on file.  Social History Social History   Tobacco Use  . Smoking status: Current Every Day Smoker    Packs/day: 0.25    Types: Cigarettes  . Smokeless tobacco: Never Used  Substance Use Topics  . Alcohol use: Yes    Comment: 2-3 beers a week  . Drug use: No    Comment: drug screen positive for THC     Allergies   Vicodin [hydrocodone-acetaminophen]   Review of Systems Review of Systems  Constitutional: Negative for chills and fever.  HENT: Negative for congestion, rhinorrhea and sore throat.   Respiratory: Negative for cough and shortness of breath.   Cardiovascular: Negative for chest pain.  Gastrointestinal: Positive for abdominal pain and nausea. Negative for constipation, diarrhea and vomiting.  Genitourinary: Negative for dysuria, flank pain and frequency.  Musculoskeletal: Positive for back pain. Negative for arthralgias and myalgias.  Skin: Negative for color change and rash.  Neurological: Negative for dizziness, syncope, weakness, light-headedness and numbness.  All other systems reviewed and are negative.    Physical Exam Updated Vital Signs BP 116/64 (BP Location: Left Arm)   Pulse (!) 53   Temp 98.8 F (37.1 C) (Oral)   Resp 16   Ht 5\' 7"  (1.702 m)   Wt 76.2 kg   SpO2 100%   BMI 26.31 kg/m   Physical Exam Vitals signs and nursing note reviewed.  Constitutional:       General: He is not in acute distress.    Appearance: He is well-developed and normal weight. He is not ill-appearing or diaphoretic.  HENT:     Head: Normocephalic and atraumatic.     Mouth/Throat:     Mouth: Mucous membranes are moist.     Pharynx: Oropharynx is clear.  Eyes:     General:        Right eye: No discharge.        Left eye: No discharge.     Pupils: Pupils are equal, round, and reactive to light.  Neck:     Musculoskeletal: Neck supple.  Cardiovascular:     Rate and Rhythm: Normal rate and regular rhythm.     Heart sounds: Normal heart sounds.  No murmur. No friction rub. No gallop.   Pulmonary:     Effort: Pulmonary effort is normal. No respiratory distress.     Breath sounds: Normal breath sounds. No wheezing or rales.     Comments: Respirations equal and unlabored, patient able to speak in full sentences, lungs clear to auscultation bilaterally Abdominal:     General: Bowel sounds are normal. There is no distension.     Palpations: Abdomen is soft. There is no mass.     Tenderness: There is abdominal tenderness. There is no guarding.     Comments: Abdomen is soft, nondistended, bowel sounds present throughout, there is very mild tenderness in the epigastrium, all other quadrants nontender palpation, no guarding or rebound tenderness.  Musculoskeletal:        General: No deformity.       Back:  Skin:    General: Skin is warm and dry.     Capillary Refill: Capillary refill takes less than 2 seconds.  Neurological:     Mental Status: He is alert and oriented to person, place, and time.     Coordination: Coordination normal.     Comments: Speech is clear, able to follow commands CN III-XII intact Normal strength in upper and lower extremities bilaterally including dorsiflexion and plantar flexion, strong and equal grip strength Sensation normal to light and sharp touch Moves extremities without ataxia, coordination intact   Psychiatric:        Mood and Affect:  Mood normal.        Behavior: Behavior normal.      ED Treatments / Results  Labs (all labs ordered are listed, but only abnormal results are displayed) Labs Reviewed  CBC WITH DIFFERENTIAL/PLATELET - Abnormal; Notable for the following components:      Result Value   Basophils Absolute 0.2 (*)    All other components within normal limits  COMPREHENSIVE METABOLIC PANEL  LIPASE, BLOOD    EKG None  Radiology Dg Chest Port 1 View  Result Date: 11/25/2018 CLINICAL DATA:  Abdominal pain, vomiting EXAM: PORTABLE CHEST 1 VIEW COMPARISON:  07/14/2018 FINDINGS: The heart size and mediastinal contours are within normal limits. Both lungs are clear. The visualized skeletal structures are unremarkable. IMPRESSION: No active disease. Electronically Signed   By: Jerilynn Mages.  Shick M.D.   On: 11/25/2018 13:31    Procedures Procedures (including critical care time)  Medications Ordered in ED Medications  lidocaine (LIDODERM) 5 % 1 patch (1 patch Transdermal Patch Applied 11/25/18 1549)  sodium chloride 0.9 % bolus 1,000 mL (1,000 mLs Intravenous New Bag/Given 11/25/18 1337)  ondansetron (ZOFRAN) injection 4 mg (4 mg Intravenous Given 11/25/18 1336)  dicyclomine (BENTYL) injection 20 mg (20 mg Intramuscular Given 11/25/18 1338)  ketorolac (TORADOL) 30 MG/ML injection 30 mg (30 mg Intravenous Given 11/25/18 1526)  methocarbamol (ROBAXIN) tablet 500 mg (500 mg Oral Given 11/25/18 1526)     Initial Impression / Assessment and Plan / ED Course  I have reviewed the triage vital signs and the nursing notes.  Pertinent labs & imaging results that were available during my care of the patient were reviewed by me and considered in my medical decision making (see chart for details).  Patient presents to the emergency department epigastric abdominal pain and back pain.  Pain is present for 1 hour.  It was associated with some nausea but no vomiting.  No lower abdominal pain no fevers or chills.  No associated  chest pain.  Patient also reported some pain in  the back along the right lower thoracic paraspinal muscles, this tenderness is reproducible on exam.  He has mild tenderness in the epigastrium without focal guarding or peritoneal signs.  This pain started after patient lifted a large propane tank and I suspect it may be musculoskeletal in nature but eventually abdominal tenderness will check basic labs, lipase also check chest x-ray.  He is come back normal will treat symptomatically for musculoskeletal pain.  Especially given that back pain is reproducible with palpation.  I have extremely low suspicion for dissection.  Patient does not have a history of hypertension or other risk factors for aortic dissection.  He has equal pulses in bilateral upper and lower extremities with no unilateral paresthesias.  Labs are overall very reassuring, no leukocytosis, normal hemoglobin, no acute electrolyte derangements, normal renal and liver function and normal lipase.  Chest x-ray is clear.  After treatment for musculoskeletal pain patient reports some improvement.  We will have him use NSAIDs, muscle relaxers and over-the-counter lidocaine patches.  Patient also requesting medication for athlete's foot he has had this multiple times in the past, there is flaking of the skin between the toes on bilateral feet.  Will prescribe Lamisil cream.  Patient follow-up with PCP.  Return precautions discussed.  Discharged home in good condition.  Final Clinical Impressions(s) / ED Diagnoses   Final diagnoses:  Epigastric pain  Acute right-sided thoracic back pain  Tinea pedis of both feet    ED Discharge Orders         Ordered    naproxen (NAPROSYN) 500 MG tablet  2 times daily     11/25/18 1529    methocarbamol (ROBAXIN) 500 MG tablet  2 times daily     11/25/18 1529    terbinafine (LAMISIL) 1 % cream  2 times daily     11/25/18 1529           Jacqlyn Larsen, [place] 11/28/18 1643    Davonna Belling, MD  11/29/18 [phone]

## 2018-11-25 NOTE — ED Notes (Signed)
Patient verbalizes understanding of discharge instructions. Opportunity for questioning and answers were provided. Armband removed by staff, pt discharged from ED ambulatory to home.  

## 2018-11-25 NOTE — Discharge Instructions (Addendum)
I suspect your pain is likely due to muscle strain from heavy lifting today.  Your labs and chest x-ray look good.  Please use over-the-counter salon pas lidocaine patches, naproxen twice daily and Robaxin as needed to help with muscle pain.  Avoid heavy lifting, you can also use ice and heat over the area.  If you have worsening pain, you have vomiting, blood in your vomit or stools, shortness of breath, fevers or any other new or concerning symptoms return to the emergency department.  Use terbinafine cream on your athlete's foot twice daily for the next week.

## 2018-11-25 NOTE — ED Triage Notes (Signed)
Pt complains of abdominal pain x1 hour. Pt states he vomited this morning. Pt is currently trying to treat this symptoms with his PCP.

## 2019-01-29 ENCOUNTER — Telehealth: Payer: Self-pay | Admitting: *Deleted

## 2019-01-29 NOTE — Telephone Encounter (Signed)
FYI "Daanish Copes [phone]).  Dr. Lanell Persons nurse told me to call you all for a medial oncologist.  Who is a good Medical Oncologist?  My GI, Rheumatologist and PCP say they are going to refer me but I wake up every day wondering if I will live or die so I'm calling."    No referral noted at this time.   Advised unable to do a self referral.  Will notify new patient coordinator to expect referral.

## 2019-02-07 ENCOUNTER — Encounter (HOSPITAL_COMMUNITY): Payer: Self-pay | Admitting: Emergency Medicine

## 2019-02-07 ENCOUNTER — Other Ambulatory Visit: Payer: Self-pay

## 2019-02-07 ENCOUNTER — Emergency Department (HOSPITAL_COMMUNITY)
Admission: EM | Admit: 2019-02-07 | Discharge: 2019-02-08 | Disposition: A | Discharge status: Home / Self Care | Payer: 59 | Source: Home / Self Care | Attending: Emergency Medicine | Admitting: Emergency Medicine

## 2019-02-07 ENCOUNTER — Emergency Department (HOSPITAL_COMMUNITY)
Admission: EM | Admit: 2019-02-07 | Discharge: 2019-02-07 | Discharge status: Against Medical Advice | Payer: 59 | Attending: Emergency Medicine | Admitting: Emergency Medicine

## 2019-02-07 DIAGNOSIS — Z5321 Procedure and treatment not carried out due to patient leaving prior to being seen by health care provider: Secondary | ICD-10-CM | POA: Diagnosis not present

## 2019-02-07 DIAGNOSIS — R1110 Vomiting, unspecified: Secondary | ICD-10-CM | POA: Insufficient documentation

## 2019-02-07 DIAGNOSIS — F17210 Nicotine dependence, cigarettes, uncomplicated: Secondary | ICD-10-CM | POA: Insufficient documentation

## 2019-02-07 DIAGNOSIS — R0789 Other chest pain: Secondary | ICD-10-CM | POA: Diagnosis present

## 2019-02-07 DIAGNOSIS — R140 Abdominal distension (gaseous): Secondary | ICD-10-CM | POA: Insufficient documentation

## 2019-02-07 DIAGNOSIS — R198 Other specified symptoms and signs involving the digestive system and abdomen: Secondary | ICD-10-CM

## 2019-02-07 DIAGNOSIS — Z79899 Other long term (current) drug therapy: Secondary | ICD-10-CM | POA: Insufficient documentation

## 2019-02-07 LAB — URINALYSIS, ROUTINE W REFLEX MICROSCOPIC
Bacteria, UA: NONE SEEN
Bilirubin Urine: NEGATIVE
Glucose, UA: NEGATIVE mg/dL
Ketones, ur: 5 mg/dL — AB
Leukocytes,Ua: NEGATIVE
Nitrite: NEGATIVE
Protein, ur: 30 mg/dL — AB
Specific Gravity, Urine: 1.029 (ref 1.005–1.030)
pH: 5 (ref 5.0–8.0)

## 2019-02-07 LAB — CBC
HCT: 44.2 % (ref 39.0–52.0)
Hemoglobin: 14.8 g/dL (ref 13.0–17.0)
MCH: 27.8 pg (ref 26.0–34.0)
MCHC: 33.5 g/dL (ref 30.0–36.0)
MCV: 82.9 fL (ref 80.0–100.0)
Platelets: 188 10*3/uL (ref 150–400)
RBC: 5.33 MIL/uL (ref 4.22–5.81)
RDW: 14.4 % (ref 11.5–15.5)
WBC: 7.7 10*3/uL (ref 4.0–10.5)
nRBC: 0 % (ref 0.0–0.2)

## 2019-02-07 LAB — COMPREHENSIVE METABOLIC PANEL
ALT: 15 U/L (ref 0–44)
AST: 28 U/L (ref 15–41)
Albumin: 4.4 g/dL (ref 3.5–5.0)
Alkaline Phosphatase: 72 U/L (ref 38–126)
Anion gap: 10 (ref 5–15)
BUN: 13 mg/dL (ref 6–20)
CO2: 20 mmol/L — ABNORMAL LOW (ref 22–32)
Calcium: 8.9 mg/dL (ref 8.9–10.3)
Chloride: 105 mmol/L (ref 98–111)
Creatinine, Ser: 1.05 mg/dL (ref 0.61–1.24)
GFR calc Af Amer: >60 mL/min (ref >60)
GFR calc non Af Amer: >60 mL/min (ref >60)
Glucose, Bld: 113 mg/dL — ABNORMAL HIGH (ref 70–99)
Potassium: 3.8 mmol/L (ref 3.5–5.1)
Sodium: 135 mmol/L (ref 135–145)
Total Bilirubin: 0.9 mg/dL (ref 0.3–1.2)
Total Protein: 7.1 g/dL (ref 6.5–8.1)

## 2019-02-07 LAB — LIPASE, BLOOD: Lipase: 31 U/L (ref 11–51)

## 2019-02-07 MED ORDER — SODIUM CHLORIDE 0.9% FLUSH: 3.0000 mL | Freq: Once | INTRAVENOUS | Status: DC

## 2019-02-07 NOTE — ED Triage Notes (Signed)
Pt reports being seen multiple times. Pt reports he is having abdominal pain and tenderness starting today. Pt reports nausea and vomiting however this is a recurrent issue for the last 8 years. Pt feels bloated.

## 2019-02-07 NOTE — ED Provider Notes (Signed)
[facility] DEPT Provider Note   CSN: [id number] Arrival date & time: 02/07/19  2300    History   Chief Complaint Chief Complaint  Patient presents with  . Abdominal Pain  . Emesis    HPI Clinton Dean is a 33 y.o. male with a history of schizophrenia, bipolar 1 disorder, suicide attempt, and cannabis dependence who presents to the emergency department with a chief complaint of " I have to know what I have lymphoma or non-Hodgkin's lymphoma or not."  The patient reports that earlier today he began having some sensitivity to touching his abdomen.  He also reports that his abdomen has been more bloated and he has had "a 40 pound weight loss over the past 3 weeks, going from 180-159 lbs."   The patient reports that his primary care provider is Dr. Kathyrn Lass at [facility].  He reports that he is also established with Dr. Paulita Fujita with GI and Dr. Silvestre Gunner with rheumatology.  He reports that he is in the process of being referred to an oncologist.  He reports he is being worked up for cancer.  He states that he was supposed to have an endoscopy and colonoscopy tomorrow, but these have been moved to August 17 " because Dr. Paulita Fujita would not see him again until he is established with oncology."  He reports that rheumatology will also not see him again until he is established with an oncologist.  He reports they have been running "lots of tests on him" over the last few weeks and several have come back as positive. Reports he was diagnosed with mononucleosis 3 weeks ago.  Reports that he has been having a sore throat, but reports it has been improving.  He states he has been having daily vomiting in the morning for the last 3 years. He vomited once this morning, but no additional episodes of vomiting.  He does actively endorse nausea.  He reports that he was incarcerated several years ago and was diagnosed with H. Pylori and they suspected MALT lymphoma. Reports Dr.  Sabra Heck rechecked him for H. Pylori in the last year and he was negative.   The patient states he is concerned because both his mother and grandmother were diagnosed with sarcoma of the liver. States he younger brother died several years ago from "something in his liver and he was dead before they could even figure out what it was."   He states "I'm a biology major, and I've been looking up all the symptoms for lymphoma online. I have all of them except HIV. I know I don't have HIV because I come to the ER every 6 months to get tested even when I'm not having symptoms."      The history is provided by the patient. No language interpreter was used.    Past Medical History:  Diagnosis Date  . Bipolar 1 disorder ([place])   . Schizophrenia [facility])     Patient Active Problem List   Diagnosis Date Noted  . Suicide attempt ([place]) 01/19/2015  . Cannabis dependence ([place])   . MDD (major depressive disorder), recurrent severe, without psychosis ([place]) 12/09/2014  . Suicide attempt by drug ingestion ([place]) 12/09/2014  . Bipolar 1 disorder, mixed, severe ([place]) 12/08/2014  . Cannabis dependence, continuous ([place]) 12/08/2014    Past Surgical History:  Procedure Laterality Date  . APPENDECTOMY          Home Medications    Prior to Admission medications   Medication Sig  Start Date End Date Taking? Authorizing Provider  CALCIUM-MAGNESIUM-ZINC PO Take 1 tablet by mouth daily.   Yes [provider]  Ferrous Sulfate (SLOW RELEASE IRON PO) Take 1 tablet by mouth daily.   Yes [provider]  methocarbamol (ROBAXIN) 500 MG tablet Take 1 tablet (500 mg total) by mouth 2 (two) times daily. 11/25/18  Yes Jacqlyn Larsen, PA-C  naproxen (NAPROSYN) 500 MG tablet Take 1 tablet (500 mg total) by mouth 2 (two) times daily. 11/25/18  Yes Jacqlyn Larsen, PA-C  naproxen sodium (ALEVE) 220 MG tablet Take 220-440 mg by mouth as needed (pain or headache).   Yes [provider]  omeprazole  (PRILOSEC) 20 MG capsule Take 20 mg by mouth 2 (two) times a day.   Yes [provider]  terbinafine (LAMISIL) 1 % cream Apply 1 application topically 2 (two) times daily. 11/25/18  Yes Jacqlyn Larsen, PA-C  Vitamin D, Ergocalciferol, (DRISDOL) 1.25 MG (50000 UT) CAPS capsule Take 50,000 Units by mouth every 7 (seven) days.   Yes [provider]  famotidine (PEPCID) 20 MG tablet Take 1 tablet (20 mg total) by mouth 2 (two) times daily. 02/08/19   Keisa Blow A, PA-C  ondansetron (ZOFRAN ODT) 4 MG disintegrating tablet Take 1 tablet (4 mg total) by mouth every 8 (eight) hours as needed. 02/08/19   Jaydn Fincher A, PA-C  albuterol (PROVENTIL HFA;VENTOLIN HFA) 108 (90 Base) MCG/ACT inhaler Inhale 2 puffs into the lungs every 4 (four) hours as needed for wheezing or shortness of breath. Patient not taking: Reported on 01/03/2018 08/31/17 02/08/19  Paulette Blanch, MD  divalproex (DEPAKOTE) 500 MG DR tablet Take 1 tablet (500 mg total) by mouth every 12 (twelve) hours. Patient not taking: Reported on 01/03/2018 01/21/15 02/08/19  Pucilowska, Wardell Honour, MD  OLANZapine (ZYPREXA) 5 MG tablet Take 1 tablet (5 mg total) by mouth at bedtime. Patient not taking: Reported on 01/03/2018 01/21/15 02/08/19  Pucilowska, Herma Ard B, MD  pantoprazole (PROTONIX) 20 MG tablet Take 1 tablet (20 mg total) by mouth daily. For reflux. Patient not taking: Reported on 01/19/2015 12/11/14 02/08/19  Niel Hummer, NP  traZODone (DESYREL) 50 MG tablet Take 1 tablet (50 mg total) by mouth at bedtime and may repeat dose one time if needed. Patient not taking: Reported on 01/03/2018 01/21/15 02/08/19  Clovis Fredrickson, MD    Family History History reviewed. No pertinent family history.  Social History Social History   Tobacco Use  . Smoking status: Current Every Day Smoker    Packs/day: 0.25    Types: Cigarettes  . Smokeless tobacco: Never Used  Substance Use Topics  . Alcohol use: Yes    Comment: 2-3 beers a week  .  Drug use: No    Comment: drug screen positive for THC     Allergies   Vicodin [hydrocodone-acetaminophen]   Review of Systems Review of Systems  Constitutional: Negative for appetite change and fever.  HENT: Positive for sore throat. Negative for congestion, ear pain, sinus pressure, sinus pain and trouble swallowing.   Eyes: Negative for visual disturbance.  Respiratory: Negative for shortness of breath and wheezing.   Cardiovascular: Negative for chest pain, palpitations and leg swelling.  Gastrointestinal: Positive for vomiting (chronic). Negative for diarrhea.  Genitourinary: Negative for discharge, dysuria, flank pain, frequency, penile pain, scrotal swelling, testicular pain and urgency.  Musculoskeletal: Negative for back pain, myalgias, neck pain and neck stiffness.  Skin: Negative for rash.  Allergic/Immunologic: Negative for  immunocompromised state.  Neurological: Negative for dizziness, weakness, numbness and headaches.  Psychiatric/Behavioral: Negative for confusion.    Physical Exam Updated Vital Signs BP 133/87 (BP Location: Right Arm)   Pulse 77   Temp 98.7 F (37.1 C) (Oral)   Resp 16   Ht 5\' 7"  (1.702 m)   Wt 72.1 kg   SpO2 100%   BMI 24.90 kg/m   Physical Exam Vitals signs and nursing note reviewed.  Constitutional:      Appearance: He is well-developed. He is not ill-appearing, toxic-appearing or diaphoretic.  HENT:     Head: Normocephalic.     Mouth/Throat:     Comments: No drooling or trismus.  Eyes:     Conjunctiva/sclera: Conjunctivae normal.  Neck:     Musculoskeletal: Neck supple.  Cardiovascular:     Rate and Rhythm: Normal rate and regular rhythm.     Pulses: Normal pulses.     Heart sounds: Normal heart sounds. No murmur. No friction rub. No gallop.   Pulmonary:     Effort: Pulmonary effort is normal. No respiratory distress.     Breath sounds: No stridor. No wheezing, rhonchi or rales.     Comments: Tolerating secretions without  difficulty. No tripodding.  Chest:     Chest wall: No tenderness.  Abdominal:     General: There is no distension.     Palpations: Abdomen is soft.     Tenderness: There is no right CVA tenderness.  Skin:    General: Skin is warm and dry.  Neurological:     Mental Status: He is alert.  Psychiatric:        Behavior: Behavior normal.      ED Treatments / Results  Labs (all labs ordered are listed, but only abnormal results are displayed) Labs Reviewed - No data to display  EKG None  Radiology No results found.  Procedures Procedures (including critical care time)  Medications Ordered in ED Medications  ondansetron (ZOFRAN-ODT) disintegrating tablet 4 mg (4 mg Oral Given 02/08/19 0015)     Initial Impression / Assessment and Plan / ED Course  I have reviewed the triage vital signs and the nursing notes.  Pertinent labs & imaging results that were available during my care of the patient were reviewed by me and considered in my medical decision making (see chart for details).        33 year old male with a history of schizophrenia, bipolar 1 disorder, suicide attempt, and cannabis dependence presenting with abdominal pain.  The patient was evaluated for similar in May 2020.  After a lengthy history taking discussion with the patient, his primary concern is that he is concerned that he has lymphoma and would like to be diagnosed in the ER today.  The patient reports that he is established with GI, rheumatology, primary care, is in the process of getting established with heme-onc.  Unfortunately, records from the specialist are not available through epic or care everywhere.  The patient was initially at [facility] and left, but labs were drawn.  These labs were reviewed and are unremarkable.  On exam, the patient does not have a surgical abdomen.  He is hemodynamically stable and in no acute distress.  At this time, I feel that no further urgent or emergent work-up is indicated.   Return precautions to the ER given.  He is hemodynamically stable and in no acute distress.  Will discharge the patient with supportive meds, and I advised to follow-up with his medical  team in the outpatient setting.   Final Clinical Impressions(s) / ED Diagnoses   Final diagnoses:  Chronic vomiting  Generalized abdominal fullness    ED Discharge Orders         Ordered    ondansetron (ZOFRAN ODT) 4 MG disintegrating tablet  Every 8 hours PRN     02/08/19 0104    famotidine (PEPCID) 20 MG tablet  2 times daily     02/08/19 0104           Inell Mimbs A, PA-C 02/08/19 3967    Ripley Fraise, MD 02/08/19 [phone]

## 2019-02-07 NOTE — ED Notes (Signed)
Pt reports he can't wait any longer and decided to LWBS.

## 2019-02-07 NOTE — ED Triage Notes (Addendum)
Patient complaining of abdominal pain. Patient states he having vomiting and nausea. Patient has had it on and off for 8 years. Patient left [facility] and came her. Patient has had blood work and urine done. Patient states it hurts to walk. Patient states he feel like he is having trouble swallowing also.

## 2019-02-08 MED ORDER — FAMOTIDINE 20 MG PO TABS: 20.0000 mg | ORAL_TABLET | Freq: Two times a day (BID) | ORAL | 0 refills | Status: AC

## 2019-02-08 MED ORDER — ONDANSETRON 4 MG PO TBDP
4.0000 mg | ORAL_TABLET | Freq: Once | ORAL | Status: AC
  Administered 2019-02-08: 4 mg via ORAL
  Filled 2019-02-08: qty 1

## 2019-02-08 MED ORDER — ONDANSETRON 4 MG PO TBDP: 4.0000 mg | ORAL_TABLET | Freq: Three times a day (TID) | ORAL | 0 refills | Status: AC | PRN

## 2019-02-08 NOTE — Discharge Instructions (Signed)
Thank you for allowing me to care for you today in the Emergency Department.   Your urine does show some microscopic blood in it today.  This has been present in your last couple of urine samples.  Please follow-up with Dr. Sabra Heck to ensure that this has further been evaluated in the outpatient setting.  Let 1 tablet of Zofran dissolve in your tongue every 8 hours as needed for nausea or vomiting.  Take Pepcid as prescribed for upset stomach.  Keep your upcoming follow-up appointments with your outpatient specialist.  Return to the emergency department if you develop persistent vomiting, severe abdominal pain with high fevers, black or bloody stools, or other new, concerning symptoms.

## 2019-03-16 ENCOUNTER — Other Ambulatory Visit: Payer: Self-pay

## 2019-03-16 ENCOUNTER — Encounter (HOSPITAL_COMMUNITY): Payer: Self-pay | Admitting: Emergency Medicine

## 2019-03-16 ENCOUNTER — Emergency Department (HOSPITAL_COMMUNITY)
Admission: EM | Admit: 2019-03-16 | Discharge: 2019-03-16 | Disposition: A | Discharge status: Home / Self Care | Payer: 59 | Attending: Emergency Medicine | Admitting: Emergency Medicine

## 2019-03-16 ENCOUNTER — Emergency Department (HOSPITAL_COMMUNITY): Payer: 59

## 2019-03-16 DIAGNOSIS — Y999 Unspecified external cause status: Secondary | ICD-10-CM | POA: Diagnosis not present

## 2019-03-16 DIAGNOSIS — Y9389 Activity, other specified: Secondary | ICD-10-CM | POA: Diagnosis not present

## 2019-03-16 DIAGNOSIS — F17210 Nicotine dependence, cigarettes, uncomplicated: Secondary | ICD-10-CM | POA: Insufficient documentation

## 2019-03-16 DIAGNOSIS — Y92009 Unspecified place in unspecified non-institutional (private) residence as the place of occurrence of the external cause: Secondary | ICD-10-CM | POA: Insufficient documentation

## 2019-03-16 DIAGNOSIS — Z79899 Other long term (current) drug therapy: Secondary | ICD-10-CM | POA: Insufficient documentation

## 2019-03-16 DIAGNOSIS — S43402A Unspecified sprain of left shoulder joint, initial encounter: Secondary | ICD-10-CM | POA: Insufficient documentation

## 2019-03-16 DIAGNOSIS — S4992XA Unspecified injury of left shoulder and upper arm, initial encounter: Secondary | ICD-10-CM | POA: Diagnosis present

## 2019-03-16 DIAGNOSIS — T148XXA Other injury of unspecified body region, initial encounter: Secondary | ICD-10-CM

## 2019-03-16 DIAGNOSIS — X500XXA Overexertion from strenuous movement or load, initial encounter: Secondary | ICD-10-CM | POA: Diagnosis not present

## 2019-03-16 DIAGNOSIS — Z791 Long term (current) use of non-steroidal anti-inflammatories (NSAID): Secondary | ICD-10-CM | POA: Insufficient documentation

## 2019-03-16 MED ORDER — METHOCARBAMOL 500 MG PO TABS: 500.0000 mg | ORAL_TABLET | Freq: Two times a day (BID) | ORAL | 0 refills | Status: AC

## 2019-03-16 MED ORDER — DIAZEPAM 5 MG PO TABS
5.0000 mg | ORAL_TABLET | Freq: Once | ORAL | Status: AC
  Administered 2019-03-16: 20:00:00 5 mg via ORAL
  Filled 2019-03-16: qty 1

## 2019-03-16 NOTE — Discharge Instructions (Addendum)
Your x-ray today was negative.  I prescribed a short course of muscle relaxers to help with your pain.  You may also apply ice or heat to the area to help with your symptoms.  Please follow-up with your primary care physician as needed.

## 2019-03-16 NOTE — ED Notes (Signed)
An After Visit Summary was printed and given to the patient. Discharge instructions given and no further questions at this time.  

## 2019-03-16 NOTE — ED Provider Notes (Signed)
[name] DEPT Provider Note   CSN: [id number] Arrival date & time: 03/16/19  1746     History   Chief Complaint Chief Complaint  Patient presents with  . Back Pain    HPI Clinton Dean is a 33 y.o. male.     33 y.o male with a PMH Bipolar, Schizophrenia presents to the ED with a chief complaint of left shoulder pain x a couple of hours ago. Patient reports he was lifting boxes along with rearranging stuff around his home when he began to feel this sharp pain to the left lower scapular region, states his pain is worse with movement of his left arm.  He reports the pain kind of radiates across his back.  Patient reports he took some Robaxin earlier today which did help some with symptoms.  He denies any trauma, other injury.  According to patient he is currently driving to [facility] tomorrow for an oncologist appointment, he would like to have this taken care of prior to his drive.  Denies any chest pain, shortness of breath.  The history is provided by the patient.  Back Pain Associated symptoms: no fever     Past Medical History:  Diagnosis Date  . Bipolar 1 disorder ([place])   . Schizophrenia [facility])     Patient Active Problem List   Diagnosis Date Noted  . Suicide attempt ([place]) 01/19/2015  . Cannabis dependence ([place])   . MDD (major depressive disorder), recurrent severe, without psychosis ([place]) 12/09/2014  . Suicide attempt by drug ingestion ([place]) 12/09/2014  . Bipolar 1 disorder, mixed, severe ([place]) 12/08/2014  . Cannabis dependence, continuous ([place]) 12/08/2014    Past Surgical History:  Procedure Laterality Date  . APPENDECTOMY          Home Medications    Prior to Admission medications   Medication Sig Start Date End Date Taking? Authorizing Provider  CALCIUM-MAGNESIUM-ZINC PO Take 1 tablet by mouth daily.    [provider]  famotidine (PEPCID) 20 MG tablet Take 1 tablet (20 mg total) by mouth 2 (two) times daily.  02/08/19   McDonald, Mia A, PA-C  Ferrous Sulfate (SLOW RELEASE IRON PO) Take 1 tablet by mouth daily.    [provider]  methocarbamol (ROBAXIN) 500 MG tablet Take 1 tablet (500 mg total) by mouth 2 (two) times daily for 7 days. 03/16/19 03/23/19  Janeece Fitting, PA-C  naproxen (NAPROSYN) 500 MG tablet Take 1 tablet (500 mg total) by mouth 2 (two) times daily. 11/25/18   Jacqlyn Larsen, PA-C  naproxen sodium (ALEVE) 220 MG tablet Take 220-440 mg by mouth as needed (pain or headache).    [provider]  omeprazole (PRILOSEC) 20 MG capsule Take 20 mg by mouth 2 (two) times a day.    [provider]  ondansetron (ZOFRAN ODT) 4 MG disintegrating tablet Take 1 tablet (4 mg total) by mouth every 8 (eight) hours as needed. 02/08/19   McDonald, Mia A, PA-C  terbinafine (LAMISIL) 1 % cream Apply 1 application topically 2 (two) times daily. 11/25/18   Jacqlyn Larsen, PA-C  Vitamin D, Ergocalciferol, (DRISDOL) 1.25 MG (50000 UT) CAPS capsule Take 50,000 Units by mouth every 7 (seven) days.    [provider]  albuterol (PROVENTIL HFA;VENTOLIN HFA) 108 (90 Base) MCG/ACT inhaler Inhale 2 puffs into the lungs every 4 (four) hours as needed for wheezing or shortness of breath. Patient not taking: Reported on 01/03/2018 08/31/17 02/08/19  Paulette Blanch, MD  divalproex (DEPAKOTE) 500 MG DR tablet Take 1 tablet (500 mg total) by mouth every 12 (twelve) hours. Patient not taking: Reported on 01/03/2018 01/21/15 02/08/19  Pucilowska, Wardell Honour, MD  OLANZapine (ZYPREXA) 5 MG tablet Take 1 tablet (5 mg total) by mouth at bedtime. Patient not taking: Reported on 01/03/2018 01/21/15 02/08/19  Pucilowska, Herma Ard B, MD  pantoprazole (PROTONIX) 20 MG tablet Take 1 tablet (20 mg total) by mouth daily. For reflux. Patient not taking: Reported on 01/19/2015 12/11/14 02/08/19  Niel Hummer, NP  traZODone (DESYREL) 50 MG tablet Take 1 tablet (50 mg total) by mouth at bedtime and may repeat dose one time if needed.  Patient not taking: Reported on 01/03/2018 01/21/15 02/08/19  Clovis Fredrickson, MD    Family History No family history on file.  Social History Social History   Tobacco Use  . Smoking status: Current Every Day Smoker    Packs/day: 0.25    Types: Cigarettes  . Smokeless tobacco: Never Used  Substance Use Topics  . Alcohol use: Yes    Comment: 2-3 beers a week  . Drug use: No    Comment: drug screen positive for THC     Allergies   Vicodin [hydrocodone-acetaminophen]   Review of Systems Review of Systems  Constitutional: Negative for fever.  Musculoskeletal: Positive for arthralgias and back pain.     Physical Exam Updated Vital Signs BP 124/75   Pulse 81   Temp 99.3 F (37.4 C) (Oral)   Resp 17   SpO2 99%   Physical Exam Vitals signs and nursing note reviewed.  Constitutional:      Appearance: He is well-developed.  HENT:     Head: Normocephalic and atraumatic.  Eyes:     General: No scleral icterus.    Pupils: Pupils are equal, round, and reactive to light.  Neck:     Musculoskeletal: Normal range of motion.  Cardiovascular:     Heart sounds: Normal heart sounds.  Pulmonary:     Effort: Pulmonary effort is normal.     Breath sounds: Normal breath sounds. No wheezing.  Chest:     Chest wall: No tenderness.  Abdominal:     General: Bowel sounds are normal. There is no distension.     Palpations: Abdomen is soft.     Tenderness: There is no abdominal tenderness.  Musculoskeletal:        General: No tenderness or deformity.     Cervical back: He exhibits pain and spasm.       Back:  Skin:    General: Skin is warm and dry.  Neurological:     Mental Status: He is alert and oriented to person, place, and time.      ED Treatments / Results  Labs (all labs ordered are listed, but only abnormal results are displayed) Labs Reviewed - No data to display  EKG None  Radiology Dg Shoulder Left  Result Date: 03/16/2019 CLINICAL DATA:  Left  shoulder pain EXAM: LEFT SHOULDER - 2+ VIEW COMPARISON:  None. FINDINGS: There is no evidence of fracture or dislocation. There is no evidence of arthropathy or other focal bone abnormality. Soft tissues are unremarkable. IMPRESSION: Negative. Electronically Signed   By: Donavan Foil M.D.   On: 03/16/2019 20:19    Procedures Procedures (including critical care time)  Medications Ordered in ED Medications  diazepam (VALIUM) tablet 5 mg (5 mg Oral Given 03/16/19 2003)     Initial Impression / Assessment and Plan / ED  Course  I have reviewed the triage vital signs and the nursing notes.  Pertinent labs & imaging results that were available during my care of the patient were reviewed by me and considered in my medical decision making (see chart for details).       Patient with extensive medical history not pertinent to today's visit presents to the ED after lifting heavy objects while at home, reports pain below his left scapula which began today.  Has taken some Robaxin to help with his symptoms with some improvement.  According to patient he has an appointment in [facility] tomorrow, will need to have this pain taken care of prior to his drive.  During primary evaluation muscle spasms are noted to his thoracic spine, specifically below his left scapula.  Low suspicion for any dislocation as he can fully range left shoulder with some pain.  Will obtain x-ray to further evaluate.  He will be given Valium while in the ED to help with muscle spasms.  X-ray of his left shoulder that not show any dislocation, fractures.  I have provided a short course of muscle relaxers to help with his pain.  Patient is advised to follow-up with PCP as needed.  He did not report any chest pain, shortness of breath on my exam.  Patient is otherwise well-appearing with stable vital signs.  Return precautions discussed at length.    Portions of this note were generated with [employer]. Dictation errors  may occur despite best attempts at proofreading.  Final Clinical Impressions(s) / ED Diagnoses   Final diagnoses:  Muscle strain    ED Discharge Orders         Ordered    methocarbamol (ROBAXIN) 500 MG tablet  2 times daily     03/16/19 2040           Janeece Fitting, PA-C 03/16/19 2040    Daleen Bo, MD 03/17/19 1011

## 2019-03-16 NOTE — ED Triage Notes (Signed)
Pt reports that c/o left shoulder/neck upper back pains that started today after lifting and moving things while he is getting stuff situated bc he is going to [place] tomorrow night to find out if he has lymphoma or stomach/intestine cancer due to his chronic stomach issues.

## 2019-07-24 ENCOUNTER — Other Ambulatory Visit: Payer: Self-pay

## 2019-07-24 ENCOUNTER — Encounter (HOSPITAL_COMMUNITY): Payer: Self-pay

## 2019-07-24 ENCOUNTER — Emergency Department (HOSPITAL_COMMUNITY): Payer: 59

## 2019-07-24 ENCOUNTER — Emergency Department (HOSPITAL_COMMUNITY)
Admission: EM | Admit: 2019-07-24 | Discharge: 2019-07-24 | Disposition: A | Discharge status: Home / Self Care | Payer: 59 | Attending: Emergency Medicine | Admitting: Emergency Medicine

## 2019-07-24 DIAGNOSIS — G8929 Other chronic pain: Secondary | ICD-10-CM | POA: Diagnosis not present

## 2019-07-24 DIAGNOSIS — M25512 Pain in left shoulder: Secondary | ICD-10-CM | POA: Diagnosis not present

## 2019-07-24 DIAGNOSIS — Z5321 Procedure and treatment not carried out due to patient leaving prior to being seen by health care provider: Secondary | ICD-10-CM | POA: Insufficient documentation

## 2019-07-24 DIAGNOSIS — R200 Anesthesia of skin: Secondary | ICD-10-CM | POA: Diagnosis present

## 2019-07-24 DIAGNOSIS — M542 Cervicalgia: Secondary | ICD-10-CM | POA: Insufficient documentation

## 2019-07-24 MED ORDER — KETOROLAC TROMETHAMINE 60 MG/2ML IM SOLN
60.0000 mg | Freq: Once | INTRAMUSCULAR | Status: DC
  Filled 2019-07-24: qty 2

## 2019-07-24 NOTE — ED Notes (Signed)
Registration informed Probation officer that pt ambulated out of triage and stated he was leaving to registration staff.

## 2019-07-24 NOTE — ED Notes (Signed)
Pt not present in room upon assessment. Registration notified staff pt left/ Tomi Bamberger MD and Gerald Stabs PA notifed

## 2019-07-24 NOTE — ED Triage Notes (Signed)
Patient states he has had numbness, tingling, and pain of the left neck, shoulder, and arm x 1 1/2 years. Patient states he was given a shot in the left arm at [facility] in [place] on 07/16/19. Patient states he does not have cancer. Patient states he began having severe increase of symptoms of the left shoulder and feels like his left shoulder is "tearing". Patient also states he has numbness of the left arm and hand.

## 2019-07-26 NOTE — ED Provider Notes (Cosign Needed)
Patient presented to the emergency department with left shoulder pain is been ongoing for over a year and a half.  The patient states that he has been seeking treatment for this and he got a shot at [facility] [place] in [place].  The patient had x-rays performed and we did order further additional x-rays and he left the room before I was able to see him.  We attempted to locate the patient for about 30 minutes and were able to do so.   Dalia Heading, PA-C 07/26/19 2310

## 2020-08-03 IMAGING — CR DG SHOULDER 2+V*L*
2 series · 2 of 2 positions shown · non-contrast
Comparison: None.

CLINICAL DATA: Left shoulder pain.

EXAM:
LEFT SHOULDER - 2+ VIEW

[w shoulder external left]
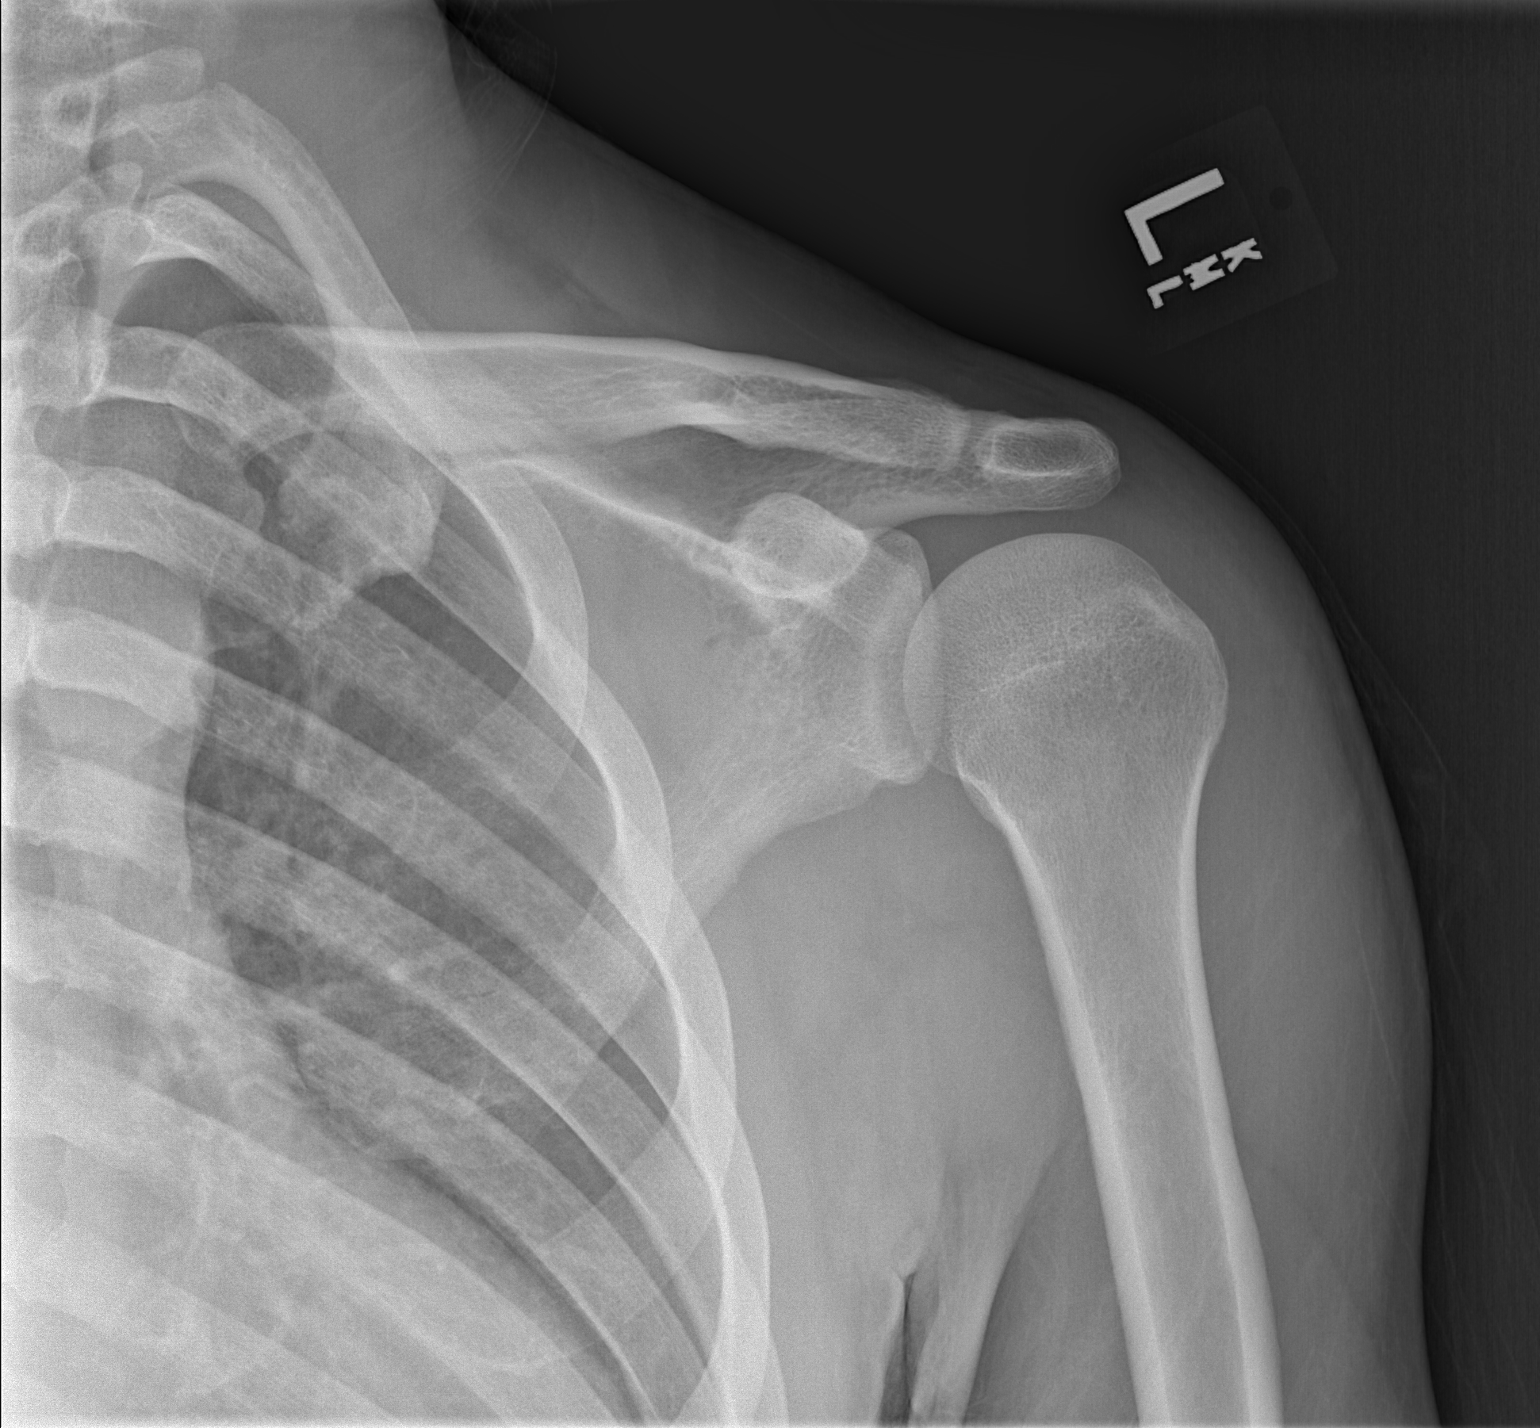

[w shoulder y-view left]
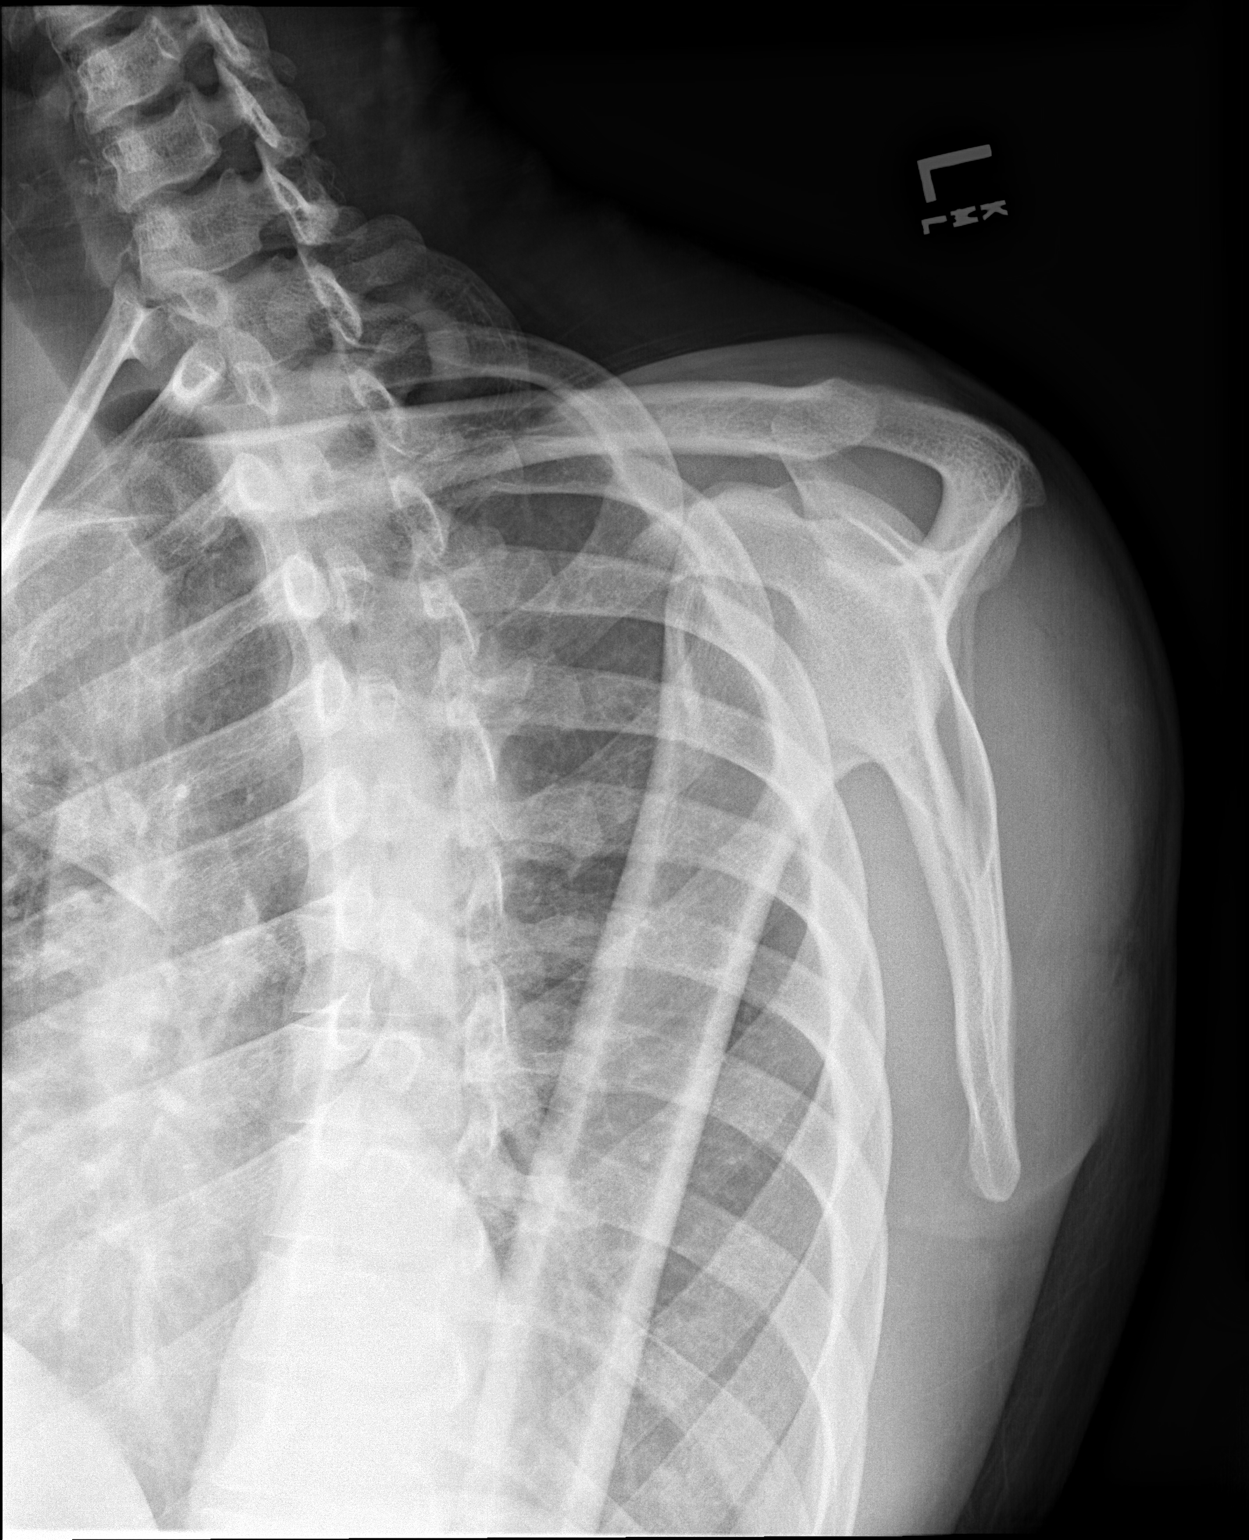

[2 of 2 positions shown; findings below may reference images not displayed]

FINDINGS: There is no evidence of fracture or dislocation. There is no
evidence of arthropathy or other focal bone abnormality. Soft
tissues are unremarkable.
IMPRESSION: Negative.

## 2020-08-05 ENCOUNTER — Emergency Department (HOSPITAL_COMMUNITY): Admission: EM | Admit: 2020-08-05 | Discharge: 2020-08-05 | Discharge status: Absent without leave | Payer: 59

## 2020-08-05 NOTE — ED Notes (Signed)
Called no answer X3

## 2021-08-10 ENCOUNTER — Encounter (HOSPITAL_COMMUNITY): Payer: Self-pay | Admitting: Emergency Medicine

## 2021-08-10 ENCOUNTER — Emergency Department (HOSPITAL_COMMUNITY)
Admission: EM | Admit: 2021-08-10 | Discharge: 2021-08-11 | Disposition: A | Discharge status: Home / Self Care | Payer: Self-pay | Attending: Emergency Medicine | Admitting: Emergency Medicine

## 2021-08-10 ENCOUNTER — Other Ambulatory Visit: Payer: Self-pay

## 2021-08-10 DIAGNOSIS — R509 Fever, unspecified: Secondary | ICD-10-CM | POA: Insufficient documentation

## 2021-08-10 DIAGNOSIS — K047 Periapical abscess without sinus: Secondary | ICD-10-CM | POA: Insufficient documentation

## 2021-08-10 DIAGNOSIS — J029 Acute pharyngitis, unspecified: Secondary | ICD-10-CM | POA: Insufficient documentation

## 2021-08-10 MED ORDER — AMOXICILLIN-POT CLAVULANATE 875-125 MG PO TABS
1.0000 | ORAL_TABLET | Freq: Once | ORAL | Status: AC
  Administered 2021-08-10: 1 via ORAL
  Filled 2021-08-10: qty 1

## 2021-08-10 MED ORDER — ACETAMINOPHEN 500 MG PO TABS
1000.0000 mg | ORAL_TABLET | Freq: Once | ORAL | Status: AC
  Administered 2021-08-10: 1000 mg via ORAL
  Filled 2021-08-10: qty 2

## 2021-08-10 NOTE — ED Triage Notes (Addendum)
Patient reports worsening left lower molar pain with swelling onset last week , febrile at triage .

## 2021-08-10 NOTE — ED Provider Triage Note (Signed)
Emergency Medicine Provider Triage Evaluation Note  Garnet Overfield , a 36 y.o. male  was evaluated in triage.  Pt complains of left lower dental pain. States hx of dental pain usually better w orajel. States swelling began Monday morning. Worse since. Fever began this afternoon. Mild sore throat.   Review of Systems  Positive: Dental pain, ST Negative: Vomiting   Physical Exam  BP (!) 146/90 (BP Location: Right Arm)    Pulse 77    Temp (!) 101.2 F (38.4 C) (Oral)    Resp 20    Ht 5\' 7"  (1.702 m)    Wt 85 kg    SpO2 100%    BMI 29.35 kg/m  Gen:   Awake, no distress   Resp:  Normal effort  MSK:   Moves extremities without difficulty  Other:  Two finger trismus, febrile, uncomfortable appearing.   Medical Decision Making  Medically screening exam initiated at 11:30 PM.  Appropriate orders placed.  Nevyn Bossman was informed that the remainder of the evaluation will be completed by another provider, this initial triage assessment does not replace that evaluation, and the importance of remaining in the ED until their evaluation is complete.  CT and labs.    Pati Gallo [place], [place] 08/10/21 2333

## 2021-08-11 ENCOUNTER — Emergency Department (HOSPITAL_COMMUNITY): Payer: Self-pay

## 2021-08-11 LAB — CBC WITH DIFFERENTIAL/PLATELET
Abs Immature Granulocytes: 0.05 10*3/uL (ref 0.00–0.07)
Basophils Absolute: 0.2 10*3/uL — ABNORMAL HIGH (ref 0.0–0.1)
Basophils Relative: 1 %
Eosinophils Absolute: 0.5 10*3/uL (ref 0.0–0.5)
Eosinophils Relative: 3 %
HCT: 40.2 % (ref 39.0–52.0)
Hemoglobin: 13 g/dL (ref 13.0–17.0)
Immature Granulocytes: 0 %
Lymphocytes Relative: 23 %
Lymphs Abs: 3.3 10*3/uL (ref 0.7–4.0)
MCH: 27.7 pg (ref 26.0–34.0)
MCHC: 32.3 g/dL (ref 30.0–36.0)
MCV: 85.5 fL (ref 80.0–100.0)
Monocytes Absolute: 1.5 10*3/uL — ABNORMAL HIGH (ref 0.1–1.0)
Monocytes Relative: 10 %
Neutro Abs: 9.2 10*3/uL — ABNORMAL HIGH (ref 1.7–7.7)
Neutrophils Relative %: 63 %
Platelets: 192 10*3/uL (ref 150–400)
RBC: 4.7 MIL/uL (ref 4.22–5.81)
RDW: 15 % (ref 11.5–15.5)
WBC: 14.6 10*3/uL — ABNORMAL HIGH (ref 4.0–10.5)
nRBC: 0 % (ref 0.0–0.2)

## 2021-08-11 LAB — BASIC METABOLIC PANEL
Anion gap: 6 (ref 5–15)
BUN: 7 mg/dL (ref 6–20)
CO2: 26 mmol/L (ref 22–32)
Calcium: 9.1 mg/dL (ref 8.9–10.3)
Chloride: 108 mmol/L (ref 98–111)
Creatinine, Ser: 1.02 mg/dL (ref 0.61–1.24)
GFR, Estimated: >60 mL/min (ref >60)
Glucose, Bld: 84 mg/dL (ref 70–99)
Potassium: 3.8 mmol/L (ref 3.5–5.1)
Sodium: 140 mmol/L (ref 135–145)

## 2021-08-11 MED ORDER — IOHEXOL 300 MG/ML  SOLN
75.0000 mL | Freq: Once | INTRAMUSCULAR | Status: AC | PRN
  Administered 2021-08-11: 03:00:00 75 mL via INTRAVENOUS

## 2021-08-11 MED ORDER — AMOXICILLIN-POT CLAVULANATE 875-125 MG PO TABS: 1.0000 | ORAL_TABLET | Freq: Two times a day (BID) | ORAL | 0 refills | Status: AC

## 2021-08-11 NOTE — Discharge Instructions (Addendum)
Please call the dentist office first thing this morning when they open approximately 8 AM.  Please inform them that you are seen in emergency room and found to have a periapical abscess that tracts down to the mandibular bone.   If you have any difficulty breathing or swallowing you may always return to the emergency room.  Take antibiotics for the entire course  Please use Tylenol or ibuprofen for pain.  You may use 600 mg ibuprofen every 6 hours or 1000 mg of Tylenol every 6 hours.  You may choose to alternate between the 2.  This would be most effective.  Not to exceed 4 g of Tylenol within 24 hours.  Not to exceed 3200 mg ibuprofen 24 hours.

## 2021-08-11 NOTE — ED Provider Notes (Signed)
[facility] EMERGENCY DEPARTMENT Provider Note   CSN: [id number] Arrival date & time: 08/10/21  2239     History  Chief Complaint  Patient presents with   Dental Pain    Clinton Dean is a 36 y.o. male.   Dental Pain  Clinton Dean , a 36 y.o. male  was evaluated in triage.  Pt complains of left lower dental pain. States hx of dental pain usually better w orajel. States swelling began Monday morning. Worse since. Fever began this afternoon. Mild sore throat.   Denies any chest pain or difficulty breathing says some discomfort with swallowing but states that he is not having difficulty managing his secretions at home.     Home Medications Prior to Admission medications   Medication Sig Start Date End Date Taking? Authorizing Provider  amoxicillin-clavulanate (AUGMENTIN) 875-125 MG tablet Take 1 tablet by mouth every 12 (twelve) hours for 14 days. 08/11/21 08/25/21 Yes Rulon Abdalla S, PA  CALCIUM-MAGNESIUM-ZINC PO Take 1 tablet by mouth daily.    [provider]  famotidine (PEPCID) 20 MG tablet Take 1 tablet (20 mg total) by mouth 2 (two) times daily. 02/08/19   McDonald, Mia A, PA-C  Ferrous Sulfate (SLOW RELEASE IRON PO) Take 1 tablet by mouth daily.    [provider]  naproxen (NAPROSYN) 500 MG tablet Take 1 tablet (500 mg total) by mouth 2 (two) times daily. 11/25/18   Jacqlyn Larsen, PA-C  naproxen sodium (ALEVE) 220 MG tablet Take 220-440 mg by mouth as needed (pain or headache).    [provider]  omeprazole (PRILOSEC) 20 MG capsule Take 20 mg by mouth 2 (two) times a day.    [provider]  ondansetron (ZOFRAN ODT) 4 MG disintegrating tablet Take 1 tablet (4 mg total) by mouth every 8 (eight) hours as needed. 02/08/19   McDonald, Mia A, PA-C  terbinafine (LAMISIL) 1 % cream Apply 1 application topically 2 (two) times daily. 11/25/18   Jacqlyn Larsen, PA-C  Vitamin D, Ergocalciferol, (DRISDOL) 1.25 MG (50000 UT) CAPS  capsule Take 50,000 Units by mouth every 7 (seven) days.    [provider]  albuterol (PROVENTIL HFA;VENTOLIN HFA) 108 (90 Base) MCG/ACT inhaler Inhale 2 puffs into the lungs every 4 (four) hours as needed for wheezing or shortness of breath. Patient not taking: Reported on 01/03/2018 08/31/17 02/08/19  Paulette Blanch, MD  divalproex (DEPAKOTE) 500 MG DR tablet Take 1 tablet (500 mg total) by mouth every 12 (twelve) hours. Patient not taking: Reported on 01/03/2018 01/21/15 02/08/19  Pucilowska, Wardell Honour, MD  OLANZapine (ZYPREXA) 5 MG tablet Take 1 tablet (5 mg total) by mouth at bedtime. Patient not taking: Reported on 01/03/2018 01/21/15 02/08/19  Pucilowska, Herma Ard B, MD  pantoprazole (PROTONIX) 20 MG tablet Take 1 tablet (20 mg total) by mouth daily. For reflux. Patient not taking: Reported on 01/19/2015 12/11/14 02/08/19  Niel Hummer, NP  traZODone (DESYREL) 50 MG tablet Take 1 tablet (50 mg total) by mouth at bedtime and may repeat dose one time if needed. Patient not taking: Reported on 01/03/2018 01/21/15 02/08/19  Clovis Fredrickson, MD      Allergies    Vicodin [hydrocodone-acetaminophen]    Review of Systems   Review of Systems  Physical Exam Updated Vital Signs BP 136/89 (BP Location: Right Arm)    Pulse 79    Temp 98.8 F (37.1 C) (Oral)    Resp 20    Ht 5\' 7"  (  1.702 m)    Wt 85 kg    SpO2 100%    BMI 29.35 kg/m  Physical Exam Vitals and nursing note reviewed.  Constitutional:      Appearance: Normal appearance.     Comments: Uncomfortable appearing  HENT:     Head: Normocephalic and atraumatic.     Mouth/Throat:     Mouth: Mucous membranes are moist.     Comments: Notable left-sided lower facial swelling face is symmetric as a result of this.  2 finger trismus.  Managing secretions.   Eyes:     General: No scleral icterus.       Right eye: No discharge.        Left eye: No discharge.     Conjunctiva/sclera: Conjunctivae normal.  Neck:     Comments: No external  neck swelling Cardiovascular:     Rate and Rhythm: Normal rate.  Pulmonary:     Effort: Pulmonary effort is normal.     Breath sounds: No stridor.  Abdominal:     Tenderness: There is no abdominal tenderness.  Musculoskeletal:     Cervical back: Normal range of motion and neck supple.     Comments: Moves all 4 extremities.  Normal tone  Skin:    General: Skin is warm and dry.  Neurological:     Mental Status: He is alert and oriented to person, place, and time. Mental status is at baseline.    ED Results / Procedures / Treatments   Labs (all labs ordered are listed, but only abnormal results are displayed) Labs Reviewed  CBC WITH DIFFERENTIAL/PLATELET - Abnormal; Notable for the following components:      Result Value   WBC 14.6 (*)    Neutro Abs 9.2 (*)    Monocytes Absolute 1.5 (*)    Basophils Absolute 0.2 (*)    All other components within normal limits  BASIC METABOLIC PANEL    EKG None  Radiology CT Soft Tissue Neck W Contrast  Result Date: 08/11/2021 CLINICAL DATA:  Soft tissue swelling, left lower molar pain EXAM: CT NECK WITH CONTRAST TECHNIQUE: Multidetector CT imaging of the neck was performed using the standard protocol following the bolus administration of intravenous contrast. RADIATION DOSE REDUCTION: This exam was performed according to the departmental dose-optimization program which includes automated exposure control, adjustment of the mA and/or kV according to patient size and/or use of iterative reconstruction technique. CONTRAST:  16mL OMNIPAQUE IOHEXOL 300 MG/ML  SOLN COMPARISON:  None. FINDINGS: Periapical lucency about the left mandibular second molar (series 9, image 31 and series 11, image 42), with a tract from the lucency through the lateral mandibular cortex (series 9, image 32), and a small hypodense collection in the adjacent soft tissues, measuring up to 5 mm (series 3, image 41). Dental caries in the left first mandibular molar (series 11, image  35. Dental caries in the right third axillary molar (series 10, image 25), with periapical lucency. Pharynx and larynx: Normal. No mass or swelling. Salivary glands: No inflammation, mass, or stone. Thyroid: Normal. Lymph nodes: Prominent bilateral 1A and 1B lymph nodes, favored to be reactive. For example, a left level 1B lymph node measures up to 5 mm in short axis (series 3, image 50). No abnormal density lymph nodes. Vascular: Negative. Limited intracranial: Negative. Visualized orbits: Negative. Mastoids and visualized paranasal sinuses: Clear. Skeleton: No acute or aggressive process. Upper chest: Negative. IMPRESSION: 1. Periapical abscess about the roots of the left mandibular second molar, with a tract  through the lateral mandibular cortex to a small hypodense collection in the adjacent soft tissues, likely phlegmon or developing abscess, measuring up to 5 mm. 2. Additional periodontal disease and dental caries involving the right third maxillary molar and left first mandibular molar. Electronically Signed   By: Merilyn Baba M.D.   On: 08/11/2021 02:46    Procedures Procedures    Medications Ordered in ED Medications  amoxicillin-clavulanate (AUGMENTIN) 875-125 MG per tablet 1 tablet (1 tablet Oral Given 08/10/21 2353)  acetaminophen (TYLENOL) tablet 1,000 mg (1,000 mg Oral Given 08/10/21 2353)  iohexol (OMNIPAQUE) 300 MG/ML solution 75 mL (75 mLs Intravenous Contrast Given 08/11/21 0230)    ED Course/ Medical Decision Making/ A&P Clinical Course as of 08/11/21 0604  Wed Aug 11, 2021  0557 WBC(!): 14.6 [WF]  0558 NEUT#(!): 9.2 [WF]    Clinical Course User Index [WF] Tedd Sias, [place]                           Medical Decision Making Amount and/or Complexity of Data Reviewed Labs: ordered. Radiology: ordered.  Risk OTC drugs. Prescription drug management.   This patient presents to the ED for concern of facial swelling/dental pain, this involves a number of treatment  options, and is a complaint that carries with it a moderate to high risk of complications and morbidity.  The differential diagnosis includes Ludwick's angina peritonsillar or retropharyngeal abscess palpable abscess deep space dental infection   Co morbidities: Discussed in HPI   Brief History:  Symptoms have worsened since Monday now has facial swelling is breathing well per patient and managing secretions.  Fever develops.  Patient has obvious external facial swelling left-sided.  Is febrile but not tachycardic is nontoxic-appearing and was able to take oral antibiotic and analgesia.    EMR reviewed including pt PMHx, past surgical history and past visits to ER.   See HPI for more details   Lab Tests:  I ordered and independently interpreted labs.  The pertinent results include:    Labs notable for leukocytosis with left shift normal chemistry   Imaging Studies:  Abnormal findings. I personally reviewed all imaging studies. Imaging notable for Periapical abscess with tracking of abscess fluid into surrounding tissues.  I discussed this finding with Dr. Christy Gentles and he is in agreement with my plan to have patient follow-up with dentistry.   Cardiac Monitoring:  NA NA   Medicines ordered:  I ordered medication including Augmentin, Tylenol for fever, pain, infection Reevaluation of the patient after these medicines showed that the patient improved I have reviewed the patients home medicines and have made adjustments as needed   Critical Interventions:  Imaging   Consults:  I requested consultation with dentistry,  and discussed lab and imaging findings as well as pertinent plan - they recommend: Follow-up    Reevaluation:  After the interventions noted above I re-evaluated patient and found that they have :improved   Social Determinants of Health:  The patient's social determinants of health were a factor in the care of this patient    Problem List  / ED Course:  Dental infection   Dispostion:  After consideration of the diagnostic results and the patients response to treatment, I feel that the patent would benefit from follow-up with dentistry.   Final Clinical Impression(s) / ED Diagnoses Final diagnoses:  Periapical abscess    Rx / DC Orders ED Discharge Orders  Ordered    amoxicillin-clavulanate (AUGMENTIN) 875-125 MG tablet  Every 12 hours        08/11/21 0511              Tedd Sias, PA 08/11/21 2230    Ripley Fraise, MD 08/11/21 [phone]

## 2021-08-11 NOTE — ED Notes (Signed)
Pt did not respond when called for treatment area

## 2021-08-11 NOTE — ED Provider Notes (Signed)
Patient called but does not seem to be in the waiting area. Attempted to call the phone number on file but it is not in service.  Will attempt to call pt's medical contact # (mother) at in the morning ~6am   Tedd Sias, [place] 08/11/21 3086    Ripley Fraise, MD 08/11/21 [phone]

## 2021-12-31 ENCOUNTER — Other Ambulatory Visit: Payer: Self-pay

## 2021-12-31 ENCOUNTER — Emergency Department (HOSPITAL_COMMUNITY)
Admission: EM | Admit: 2021-12-31 | Discharge: 2021-12-31 | Disposition: A | Discharge status: Home / Self Care | Payer: BC Managed Care – PPO | Attending: Emergency Medicine | Admitting: Emergency Medicine

## 2021-12-31 ENCOUNTER — Encounter (HOSPITAL_COMMUNITY): Payer: Self-pay

## 2021-12-31 DIAGNOSIS — K0889 Other specified disorders of teeth and supporting structures: Secondary | ICD-10-CM | POA: Diagnosis present

## 2021-12-31 MED ORDER — LIDOCAINE VISCOUS HCL 2 % MT SOLN
15.0000 mL | Freq: Once | OROMUCOSAL | Status: AC
  Administered 2021-12-31: 15 mL via OROMUCOSAL
  Filled 2021-12-31: qty 15

## 2021-12-31 MED ORDER — OXYCODONE-ACETAMINOPHEN 5-325 MG PO TABS
1.0000 | ORAL_TABLET | Freq: Once | ORAL | Status: AC
  Administered 2021-12-31: 1 via ORAL
  Filled 2021-12-31: qty 1

## 2021-12-31 MED ORDER — LIDOCAINE VISCOUS HCL 2 % MT SOLN: 15.0000 mL | Freq: Four times a day (QID) | OROMUCOSAL | 0 refills | Status: AC | PRN

## 2021-12-31 MED ORDER — OXYCODONE-ACETAMINOPHEN 5-325 MG PO TABS: 1.0000 | ORAL_TABLET | Freq: Three times a day (TID) | ORAL | 0 refills | Status: AC | PRN

## 2021-12-31 MED ORDER — AMOXICILLIN-POT CLAVULANATE 875-125 MG PO TABS
1.0000 | ORAL_TABLET | Freq: Once | ORAL | Status: AC
  Administered 2021-12-31: 1 via ORAL
  Filled 2021-12-31: qty 1

## 2021-12-31 MED ORDER — AMOXICILLIN-POT CLAVULANATE 875-125 MG PO TABS: 1.0000 | ORAL_TABLET | Freq: Two times a day (BID) | ORAL | 0 refills | Status: AC

## 2021-12-31 NOTE — ED Provider Notes (Signed)
11:45am Patient requested medication for pain. He was brought back to triage. I asked him about his medication allergies and did he have anything he normally took for pain since he had allergy to vicodin. Could not find record of other stronger pain medication in his chart after thorough chart review.    Upon asking the patient further questions including what did he already take today or has taken in the past, he started yelling "I want another nurse. Every time I come to the hospital I'm treated like a third class citizen. You don't even fucking care that I'm in pain."  Patient then stormed out to the waiting room without receiving medication. Multiple members of staff were present as witnesses.    Estill Cotta 12/31/21 1259    Daleen Bo, MD 01/01/22 [phone]

## 2021-12-31 NOTE — ED Notes (Signed)
Patient Alert and oriented to baseline. Stable and ambulatory to baseline. Patient verbalized understanding of the discharge instructions.  Patient belongings were taken by the patient.   

## 2021-12-31 NOTE — ED Provider Notes (Signed)
[facility] Provider Note   CSN: [id number] Arrival date & time: 12/31/21  1048     History  Chief Complaint  Patient presents with   Dental Problem    Clinton Dean is a 36 y.o. male with medical history of bipolar 1 disorder, schizophrenia, previous dental abscess.  Presents emergency department today with a complaint of dental pain.  Patient reports that he cracked his back left molar approximately 1 year prior.  Patient has been dealing with intermittent episodes of pain since then.  Patient states that current episode of pain started 2 days ago.  Pain has been constant and progressively worsening.  Patient reports taking Tylenol and ibuprofen with minimal relief in symptoms.  Patient reported fever of 102 F temp orally yesterday.  Denies any f trouble swallowing, trismus, drooling, hot potato voice, facial swelling, neck pain, neck stiffness.  HPI     Home Medications Prior to Admission medications   Medication Sig Start Date End Date Taking? Authorizing Provider  CALCIUM-MAGNESIUM-ZINC PO Take 1 tablet by mouth daily.    [provider]  famotidine (PEPCID) 20 MG tablet Take 1 tablet (20 mg total) by mouth 2 (two) times daily. 02/08/19   McDonald, Mia A, PA-C  Ferrous Sulfate (SLOW RELEASE IRON PO) Take 1 tablet by mouth daily.    [provider]  naproxen (NAPROSYN) 500 MG tablet Take 1 tablet (500 mg total) by mouth 2 (two) times daily. 11/25/18   Jacqlyn Larsen, PA-C  naproxen sodium (ALEVE) 220 MG tablet Take 220-440 mg by mouth as needed (pain or headache).    [provider]  omeprazole (PRILOSEC) 20 MG capsule Take 20 mg by mouth 2 (two) times a day.    [provider]  ondansetron (ZOFRAN ODT) 4 MG disintegrating tablet Take 1 tablet (4 mg total) by mouth every 8 (eight) hours as needed. 02/08/19   McDonald, Mia A, PA-C  terbinafine (LAMISIL) 1 % cream Apply 1 application topically 2 (two)  times daily. 11/25/18   Jacqlyn Larsen, PA-C  Vitamin D, Ergocalciferol, (DRISDOL) 1.25 MG (50000 UT) CAPS capsule Take 50,000 Units by mouth every 7 (seven) days.    [provider]  albuterol (PROVENTIL HFA;VENTOLIN HFA) 108 (90 Base) MCG/ACT inhaler Inhale 2 puffs into the lungs every 4 (four) hours as needed for wheezing or shortness of breath. Patient not taking: Reported on 01/03/2018 08/31/17 02/08/19  Paulette Blanch, MD  divalproex (DEPAKOTE) 500 MG DR tablet Take 1 tablet (500 mg total) by mouth every 12 (twelve) hours. Patient not taking: Reported on 01/03/2018 01/21/15 02/08/19  Pucilowska, Wardell Honour, MD  OLANZapine (ZYPREXA) 5 MG tablet Take 1 tablet (5 mg total) by mouth at bedtime. Patient not taking: Reported on 01/03/2018 01/21/15 02/08/19  Pucilowska, Herma Ard B, MD  pantoprazole (PROTONIX) 20 MG tablet Take 1 tablet (20 mg total) by mouth daily. For reflux. Patient not taking: Reported on 01/19/2015 12/11/14 02/08/19  Niel Hummer, NP  traZODone (DESYREL) 50 MG tablet Take 1 tablet (50 mg total) by mouth at bedtime and may repeat dose one time if needed. Patient not taking: Reported on 01/03/2018 01/21/15 02/08/19  Clovis Fredrickson, MD      Allergies    Vicodin [hydrocodone-acetaminophen]    Review of Systems   Review of Systems  Constitutional:  Positive for fever. Negative for chills.  HENT:  Positive for dental problem. Negative for drooling, trouble swallowing and voice change.  Respiratory:  Negative for shortness of breath.   Musculoskeletal:  Negative for neck pain and neck stiffness.    Physical Exam Updated Vital Signs BP 139/85 (BP Location: Right Arm)   Pulse 73   Temp 98.8 F (37.1 C) (Oral)   Resp 17   Ht '5\' 7"'$  (1.702 m)   Wt 84.8 kg   SpO2 100%   BMI 29.29 kg/m  Physical Exam Vitals and nursing note reviewed.  Constitutional:      General: He is not in acute distress.    Appearance: He is not ill-appearing, toxic-appearing or diaphoretic.  HENT:      Head: Normocephalic.     Jaw: No trismus, tenderness, swelling, pain on movement or malocclusion.     Comments: No facial swelling    Mouth/Throat:     Lips: Pink. No lesions.     Mouth: Mucous membranes are moist.     Dentition: Abnormal dentition. Dental tenderness present. No dental abscesses.     Tongue: No lesions.     Palate: No mass and lesions.     Pharynx: Oropharynx is clear. Uvula midline. No pharyngeal swelling, oropharyngeal exudate, posterior oropharyngeal erythema or uvula swelling.     Comments: Broken back left molar as indicated above.  Dental tenderness present.  No dental abscess.  Handles oral secretions without difficulty. Eyes:     General: No scleral icterus.       Right eye: No discharge.        Left eye: No discharge.  Neck:     Comments: No swelling to submandibular space Cardiovascular:     Rate and Rhythm: Normal rate.  Pulmonary:     Effort: Pulmonary effort is normal.  Musculoskeletal:     Cervical back: Full passive range of motion without pain, normal range of motion and neck supple. No edema, erythema, signs of trauma, rigidity, torticollis or crepitus. No pain with movement, spinous process tenderness or muscular tenderness. Normal range of motion.  Skin:    General: Skin is warm and dry.  Neurological:     General: No focal deficit present.     Mental Status: He is alert.  Psychiatric:        Behavior: Behavior is cooperative.     ED Results / Procedures / Treatments   Labs (all labs ordered are listed, but only abnormal results are displayed) Labs Reviewed - No data to display  EKG None  Radiology No results found.  Procedures Procedures    Medications Ordered in ED Medications  amoxicillin-clavulanate (AUGMENTIN) 875-125 MG per tablet 1 tablet (has no administration in time range)  lidocaine (XYLOCAINE) 2 % viscous mouth solution 15 mL (15 mLs Mouth/Throat Given 12/31/21 1349)  oxyCODONE-acetaminophen (PERCOCET/ROXICET) 5-325  MG per tablet 1 tablet (1 tablet Oral Given 12/31/21 1420)    ED Course/ Medical Decision Making/ A&P                           Medical Decision Making Risk Prescription drug management.   Alert 36 year old male in no acute distress, nontoxic-appearing.  Presents to the emergency department with a chief complaint of dental pain.  Information is obtained from patient.  Past medical records were reviewed including previous provider notes, labs, and imaging.  Patient has previous medical history periapical abscess which complicates his care.  On examination patient has dental trauma to back left molar with dental tenderness.  No surrounding dental/gingival abscess.  No facial swelling, swelling  to submandibular space, or swelling to floor of mouth.  Handles oral secretions without difficulty.  Patient has no pain with passive range of motion of neck.  Low suspicion for Ludwick's angina, deep space neck infection at this time.    With increased pain and tenderness concern for possible dental infection.  We will start patient on course of Augmentin.  Patient given viscous lidocaine in the emergency department for pain management with minimal improvement in symptoms.  Patient reevaluated 1 hour after receiving Percocet.  No signs of allergic reaction.  Patient reports improvement in his pain.  Will discharge patient at this time to follow-up with dentist in the outpatient setting.  Patient given prescription for Augmentin, viscous lidocaine, and Percocet.  Strict return precautions discussed.  Based on patient's chief complaint, I considered admission might be necessary, however after reassuring ED workup feel patient is reasonable for discharge.  Discussed results, findings, treatment and follow up. Patient advised of return precautions. Patient verbalized understanding and agreed with plan.  Portions of this note were generated with [employer]. Dictation errors may occur despite best  attempts at proofreading.         Final Clinical Impression(s) / ED Diagnoses Final diagnoses:  Pain, dental    Rx / DC Orders ED Discharge Orders          Ordered    amoxicillin-clavulanate (AUGMENTIN) 875-125 MG tablet  Every 12 hours        12/31/21 1522    oxyCODONE-acetaminophen (PERCOCET/ROXICET) 5-325 MG tablet  Every 8 hours PRN        12/31/21 1522    lidocaine (XYLOCAINE) 2 % solution  Every 6 hours PRN        12/31/21 1522              Loni Beckwith, PA-C 12/31/21 1526    Varney Biles, MD 01/01/22 1117

## 2021-12-31 NOTE — Discharge Instructions (Signed)
[facility] to be evaluated for your dental pain.  There is concern for possible dental infection.  Due to this you were started on the antibiotic Augmentin.  Please take this medication as prescribed.  To help with your pain I have given you prescription for Percocet and viscous lidocaine.  It is very important that you follow-up with a dentist for repeat evaluation and further management of your dental problem.  Today you received medications that may make you sleepy or impair your ability to make decisions.  For the next 24 hours please do not drive, operate heavy machinery, care for a small child with out another adult present, or perform any activities that may cause harm to you or someone else if you were to fall asleep or be impaired.   You are being prescribed a medication which may make you sleepy or mpair your ability to make decisions. Please follow up of listed precautions for at least 24 hours after taking one dose.  Get help right away if: You are unable to open your mouth. You are having trouble breathing or swallowing. You have a fever. You notice that your face, neck, or jaw is swollen. You develop any signs or symptoms of a allergic reaction including facial swelling, hives, trouble swallowing, trouble breathing,

## 2021-12-31 NOTE — ED Triage Notes (Signed)
Complains of left lower molar cracked and causing pain along with n/v.  Patient tried to go to dentist but they reported today is teeth cleaning day. Patient crying and moaning in triage.

## 2021-12-31 NOTE — ED Provider Triage Note (Signed)
Emergency Medicine Provider Triage Evaluation Note  Carleton Vanvalkenburgh , a 36 y.o. male  was evaluated in triage.  Pt complains of dental problem. Reports L lower molar cracked. Tried to go to dentist but they reported they were only doing teeth cleanings today and because his insurance hadn't kicked in yet they couldn't take him. Has been trying ibuprofen and tylenol without relief.   Review of Systems  Positive: Dental pain, n/v Negative: Fever, difficulty swallowing, difficulty breathing  Physical Exam  BP 139/85 (BP Location: Right Arm)   Pulse 73   Temp 98.8 F (37.1 C) (Oral)   Resp 17   Ht '5\' 7"'$  (1.702 m)   Wt 84.8 kg   SpO2 100%   BMI 29.29 kg/m  Gen:   Awake, no distress   Resp:  Normal effort  MSK:   Moves extremities without difficulty  Other:  Crying in triage  Medical Decision Making  Medically screening exam initiated at 11:12 AM.  Appropriate orders placed.  Shawnee Higham was informed that the remainder of the evaluation will be completed by another provider, this initial triage assessment does not replace that evaluation, and the importance of remaining in the ED until their evaluation is complete.     Kateri Plummer, PA-C 12/31/21 1112

## 2022-01-27 ENCOUNTER — Emergency Department (HOSPITAL_COMMUNITY)
Admission: EM | Admit: 2022-01-27 | Discharge: 2022-01-27 | Discharge status: Against Medical Advice | Payer: BC Managed Care – PPO | Attending: Emergency Medicine | Admitting: Emergency Medicine

## 2022-01-27 DIAGNOSIS — Z5321 Procedure and treatment not carried out due to patient leaving prior to being seen by health care provider: Secondary | ICD-10-CM | POA: Diagnosis present

## 2022-01-27 NOTE — ED Notes (Signed)
On entering the room, patient states "You have 30 seconds bro" and continues talking to other person in the room who he introduced as his ex-fiance. Patient continues to talk to ex-fiance and says that he doesn't need to be evaluated, denies suicidal/homicidal ideation and denies AVH. Patient no longer wants to be seen and states he only came to ED for evaluation at the request of ex-fiance. Patient exited department independently with steady gait and in no apparent distress at this time.
